# Patient Record
Sex: Female | Born: 1985
Health system: Southern US, Community
[De-identification: ages and names within clinical notes are randomized; demographics above are authoritative.]

## PROBLEM LIST (undated history)

## (undated) ENCOUNTER — Inpatient Hospital Stay (HOSPITAL_COMMUNITY): Payer: Self-pay

## (undated) DIAGNOSIS — A6000 Herpesviral infection of urogenital system, unspecified: Secondary | ICD-10-CM

## (undated) DIAGNOSIS — N898 Other specified noninflammatory disorders of vagina: Secondary | ICD-10-CM

## (undated) DIAGNOSIS — N760 Acute vaginitis: Secondary | ICD-10-CM

## (undated) DIAGNOSIS — Z3490 Encounter for supervision of normal pregnancy, unspecified, unspecified trimester: Secondary | ICD-10-CM

## (undated) DIAGNOSIS — B9689 Other specified bacterial agents as the cause of diseases classified elsewhere: Secondary | ICD-10-CM

## (undated) DIAGNOSIS — R519 Headache, unspecified: Secondary | ICD-10-CM

## (undated) HISTORY — DX: Headache, unspecified: R51.9

## (undated) HISTORY — DX: Acute vaginitis: N76.0

## (undated) HISTORY — DX: Other specified noninflammatory disorders of vagina: N89.8

## (undated) HISTORY — DX: Other specified bacterial agents as the cause of diseases classified elsewhere: B96.89

## (undated) HISTORY — PX: BREAST CYST EXCISION: SHX579

---
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
## 2005-02-28 ENCOUNTER — Ambulatory Visit (HOSPITAL_COMMUNITY): Admission: RE | Admit: 2005-02-28 | Discharge: 2005-02-28 | Payer: Self-pay | Admitting: *Deleted

## 2005-05-01 ENCOUNTER — Observation Stay (HOSPITAL_COMMUNITY): Admission: AD | Admit: 2005-05-01 | Discharge: 2005-05-02 | Payer: Self-pay | Admitting: *Deleted

## 2005-05-08 ENCOUNTER — Ambulatory Visit (HOSPITAL_COMMUNITY): Admission: RE | Admit: 2005-05-08 | Discharge: 2005-05-08 | Payer: Self-pay | Admitting: *Deleted

## 2005-07-10 ENCOUNTER — Ambulatory Visit (HOSPITAL_COMMUNITY): Admission: AD | Admit: 2005-07-10 | Discharge: 2005-07-10 | Payer: Self-pay | Admitting: *Deleted

## 2005-07-12 ENCOUNTER — Inpatient Hospital Stay (HOSPITAL_COMMUNITY): Admission: RE | Admit: 2005-07-12 | Discharge: 2005-07-15 | Payer: Self-pay | Admitting: *Deleted

## 2007-01-06 ENCOUNTER — Emergency Department (HOSPITAL_COMMUNITY): Admission: EM | Admit: 2007-01-06 | Discharge: 2007-01-06 | Payer: Self-pay | Admitting: Emergency Medicine

## 2007-07-31 ENCOUNTER — Emergency Department (HOSPITAL_COMMUNITY): Admission: EM | Admit: 2007-07-31 | Discharge: 2007-07-31 | Payer: Self-pay | Admitting: Emergency Medicine

## 2007-10-03 ENCOUNTER — Emergency Department (HOSPITAL_COMMUNITY): Admission: EM | Admit: 2007-10-03 | Discharge: 2007-10-03 | Payer: Self-pay | Admitting: Emergency Medicine

## 2007-10-07 ENCOUNTER — Encounter: Payer: Self-pay | Admitting: Family Medicine

## 2008-06-21 ENCOUNTER — Emergency Department (HOSPITAL_COMMUNITY): Admission: EM | Admit: 2008-06-21 | Discharge: 2008-06-22 | Payer: Self-pay | Admitting: Emergency Medicine

## 2008-12-26 ENCOUNTER — Encounter (HOSPITAL_COMMUNITY): Admission: RE | Admit: 2008-12-26 | Discharge: 2009-01-25 | Payer: Self-pay | Admitting: Family Medicine

## 2009-08-13 ENCOUNTER — Ambulatory Visit: Payer: Self-pay | Admitting: Orthopedic Surgery

## 2009-08-13 DIAGNOSIS — M238X9 Other internal derangements of unspecified knee: Secondary | ICD-10-CM | POA: Insufficient documentation

## 2009-09-04 ENCOUNTER — Encounter (HOSPITAL_COMMUNITY): Admission: RE | Admit: 2009-09-04 | Discharge: 2009-10-03 | Payer: Self-pay | Admitting: Orthopedic Surgery

## 2009-09-04 ENCOUNTER — Encounter: Payer: Self-pay | Admitting: Orthopedic Surgery

## 2009-09-17 ENCOUNTER — Encounter: Payer: Self-pay | Admitting: Orthopedic Surgery

## 2009-10-03 ENCOUNTER — Encounter: Payer: Self-pay | Admitting: Orthopedic Surgery

## 2010-04-15 ENCOUNTER — Emergency Department (HOSPITAL_COMMUNITY): Admission: EM | Admit: 2010-04-15 | Discharge: 2010-04-15 | Payer: Self-pay | Admitting: Emergency Medicine

## 2010-11-07 NOTE — Letter (Signed)
Summary: Historic Patient File  Historic Patient File   Imported By: Lind Guest 05/23/2010 16:34:37  _____________________________________________________________________  External Attachment:    Type:   Image     Comment:   External Document

## 2010-12-22 LAB — DIFFERENTIAL
Basophils Absolute: 0 10*3/uL (ref 0.0–0.1)
Basophils Relative: 1 % (ref 0–1)
Eosinophils Absolute: 0.3 10*3/uL (ref 0.0–0.7)
Eosinophils Relative: 5 % (ref 0–5)
Lymphs Abs: 2 10*3/uL (ref 0.7–4.0)
Neutrophils Relative %: 52 % (ref 43–77)

## 2010-12-22 LAB — URINALYSIS, ROUTINE W REFLEX MICROSCOPIC
Bilirubin Urine: NEGATIVE
Ketones, ur: NEGATIVE mg/dL
Leukocytes, UA: NEGATIVE
Nitrite: POSITIVE — AB
Protein, ur: NEGATIVE mg/dL

## 2010-12-22 LAB — COMPREHENSIVE METABOLIC PANEL
ALT: 9 U/L (ref 0–35)
AST: 18 U/L (ref 0–37)
Alkaline Phosphatase: 54 U/L (ref 39–117)
CO2: 26 mEq/L (ref 19–32)
Calcium: 9.1 mg/dL (ref 8.4–10.5)
Chloride: 104 mEq/L (ref 96–112)
GFR calc non Af Amer: >60 mL/min (ref >60)
Glucose, Bld: 112 mg/dL — ABNORMAL HIGH (ref 70–99)
Sodium: 136 mEq/L (ref 135–145)
Total Bilirubin: 0.3 mg/dL (ref 0.3–1.2)

## 2010-12-22 LAB — CBC
HCT: 33.8 % — ABNORMAL LOW (ref 36.0–46.0)
Hemoglobin: 11.4 g/dL — ABNORMAL LOW (ref 12.0–15.0)
MCHC: 33.8 g/dL (ref 30.0–36.0)
RBC: 3.8 MIL/uL — ABNORMAL LOW (ref 3.87–5.11)

## 2010-12-22 LAB — POCT PREGNANCY, URINE: Preg Test, Ur: NEGATIVE

## 2010-12-22 LAB — URINE CULTURE: Colony Count: >=100000

## 2011-02-21 NOTE — Op Note (Signed)
   NAME:  Sydney Mccall, Sydney Mccall                         ACCOUNT NO.:  [id number]   MEDICAL RECORD NO.:  [id number]                   PATIENT TYPE:  EMS   LOCATION:  ED                                   FACILITY:  [facility]   PHYSICIAN:  Dirk Dress. Katrinka Blazing, M.D.                DATE OF BIRTH:  1986-09-15   DATE OF PROCEDURE:  04/19/2002  DATE OF DISCHARGE:                                 OPERATIVE REPORT   PREOPERATIVE DIAGNOSIS:  Right breast mass.   POSTOPERATIVE DIAGNOSIS:  Fibroadenoma right breast.   PROCEDURE:  Right partial mastectomy.   SURGEON:  Dirk Dress. Katrinka Blazing, M.D.   DESCRIPTION OF PROCEDURE:  Under general anesthesia the patient's right  breast was prepped and draped in a sterile field.  A curvilinear incision  was made in the neck in the upper outer quadrant.  The subcutaneous tissue  was excised along with the mass.  The mass was sent for pathologic  evaluation.  The deep tissues were closed with 2-0 Biosyn.  The subcutaneous  tissues were closed with 3-0 Biosyn and the skin was closed with 4-0 Dexon.  Dressing was placed.  The patient was awakened from anesthesia, transferred  to a bed and taken to the postanesthetic care unit.                                                Dirk Dress. Katrinka Blazing, M.D.    LCS/MEDQ  D:  07/02/2002  T:  07/02/2002  Job:  [id number]

## 2011-02-21 NOTE — Op Note (Signed)
NAMEALAYLA, DETHLEFS NO.:  [id number]   MEDICAL RECORD NO.:  [id number]          PATIENT TYPE:  INP   LOCATION:  A403                          FACILITY:  [facility]   PHYSICIAN:  Langley Gauss, MD     DATE OF BIRTH:  Nov 12, 1985   DATE OF PROCEDURE:  DATE OF DISCHARGE:                                 OPERATIVE REPORT   DIAGNOSES:  1.  Intrauterine pregnancy at 40 weeks.  2.  Spontaneous rupture of membranes.   PROCEDURE PERFORMED:  1.  Spontaneous assisted vaginal delivery of macrosomic 9 pounds 4 ounces      female infant.  2.  Midline episiotomy with extension through a fourth degree perineal      laceration, repaired.   SURGEON:  Delivery performed by Langley Gauss, M.D.   ESTIMATED BLOOD LOSS:  Less than 500 cc.   COMPLICATIONS:  Extension of episiotomy with fourth degree laceration.   SPECIMENS:  Arterial cord gas and cord blood to laboratory. Placenta is  examined and noted be apparently intact with a three-vessel umbilical cord.   ANALGESIA:  The patient declined epidural during the course of labor,  received only IV narcotics for labor analgesia but after delivery of a total  of 50 mL of 1% lidocaine plain utilized episiotomy repair as well as  laceration.   DESCRIPTION OF PROCEDURE:  The patient was admitted in active labor.  Fetal  scalp electrode was placed and documented reassuring fetal heart rate.  Subsequently, the patient progressed very rapidly along the labor curve to  complete dilatation.  She was placed in the low lithotomy position, prepped  and draped in the usual sterile manner.  She pushed well during the short  second stage of labor with distension of the perineum; 30 cc 1% lidocaine  injected.  A small midline episiotomy was performed with delivery in a  direct OA position. Excellent perineal support was provided in this well-  controlled delivery.  Mouth and nares were bulb suctioned of clear amniotic  fluid.  Spontaneous rotation  occurred to a left anterior shoulder position  and gentle downward traction combined with expulsive efforts resulted in  delivery of the remainder the infant without difficulty.  Umbilical cord is  milked towards the infant.  Cord was doubly clamped and cut and infant is  taken to the nursery table for immediate reassessment.  Arterial cord gas  and cord blood were then obtained.  Gentle traction on the umbilical cord  results in separation which upon examination is felt to be intact placenta  with associated three-vessel umbilical cord.  Examination of the genital  tract reveals vaginal sidewall and periurethral area to be intact. There  was, however, about a 1 cm laceration of the rectal mucosa immediately in  the midline.  Rectal sphincter likewise was torn in the midline.  Irrigation  is performed of the perineal and pelvic area.  I reprepped with a Calgon  solution.  The rectal mucosa was identified and closed first with a single  through-and-through layer of 3-0 absorbable Monocryl suture. Tissues are  noted to be free  of any particulate matter.  My rectal examination at that  time reveals proper reapproximation of the tissue edges and no open areas.  A second running layer was placed to reapproximate the pararectal mucosal  layer in the midline to reinforce the first suture line.  The rectal  sphincter edges were then identified and grasped with Allis clamps.  These  were brought together in the midline and a series of four interrupted 2-0  Vicryl sutures are placed and then tied which restores the normal anatomy.  Calgon solution was again used to reprep the vaginal area.  The vaginal  mucosa is closed with a continuous running 0 chromic suture.  This suture  layer was continued on the perineal body to result in a two-layer closure  with restoration of the normal anatomy.  Rectovaginal examination confirms a  proper placement and good tone is noted in the rectal sphincter  postpartum.  The patient is no ordered on antibiotics,  Rocephin 1 gram IV q.12h.  Additionally, Colace 100 mg p.o. q.i.d. is started.  The patient and family  are advised of the extent of the laceration and of the importance of a  avoiding constipation in the postpartum period.      Langley Gauss, MD  Electronically Signed     DC/MEDQ  D:  07/12/2005  T:  07/13/2005  Job:  [id number]

## 2011-02-21 NOTE — H&P (Signed)
NAMELIRIO, BACH NO.:  [id number]   MEDICAL RECORD NO.:  [id number]          PATIENT TYPE:  INP   LOCATION:  LDR1                          FACILITY:  [facility]   PHYSICIAN:  Langley Gauss, MD     DATE OF BIRTH:  20-Feb-1986   DATE OF ADMISSION:  07/12/2005  DATE OF DISCHARGE:  LH                                HISTORY & PHYSICAL   HISTORY OF PRESENT ILLNESS:  The patient experienced the onset of uterine  contractions about 0230 with clear amniotic fluid.  She presented to [facility] about 0535 a.m. for admission.  The patient is a gravida 2,  para 0 at 40+ weeks gestation.  She had been seen in labor and delivery one  day previously, at which time she was not in labor.  She had no rupture of  membranes.  The patient's prenatal course has been complicated only by  reflux symptoms.  She is taking alternately Zantac and Prilosec for relief  of the reflux symptoms.   ALLERGIES:  NO KNOWN DRUG ALLERGIES.   PAST MEDICAL HISTORY:  Negative.   PAST SURGICAL HISTORY:  Negative.   PHYSICAL EXAMINATION:  GENERAL:  A pleasant black female.  VITAL SIGNS:  Blood pressure 129/87.  Weight is 185 pounds, height is 5 feet  4 inches.  Temperature 97.7, respiratory rate is 24, pulse 92.  HEENT:  Negative.  NECK:  No adenopathy.  Supple.  Thyroid is not palpable.  LUNGS:  Clear.  CARDIOVASCULAR:  Regular rate and rhythm.  ABDOMEN:  Soft and nontender.  No surgical scars are identified.  Vertex  presentation by Leopold's maneuvers.  PELVIC:  Initial examination by the nursing staff upon admission, the  patient was about 3-4 cm dilated.  I have just examined her now at 10:00  a.m., at which time she is 9 cm, 0 station, and completely effaced with  intact membranes.  Fetal scalp electrode is placed with a resultant  amniotomy.  The external monitor was revealing a reassuring fetal heart rate  with accelerations noted.  External toco reveals uterine contractions  occurring q.3-5 minutes.   PLAN:  The patient is noted to be GBS carrier status negative.  She has  declined epidural during the course of labor and is receiving only IV  narcotics.      Langley Gauss, MD  Electronically Signed    DC/MEDQ  D:  07/12/2005  T:  07/12/2005  Job:  [id number]

## 2011-02-21 NOTE — H&P (Signed)
[facility]  Patient:    Sydney Mccall, Sydney Mccall Visit Number: [id number] MRN: [id number]          Service Type: DSU Location: DAY Attending Physician:  Jeri Cos. Dictated by:   Elpidio Anis, M.D. Admit Date:  02/24/2002 Discharge Date: 02/24/2002                           History and Physical  HISTORY OF PRESENT ILLNESS:  This 25 year old female with history of right breast mass with progressive tenderness.  No history of trauma.  The mass has been present since December of 2002.  It is persistent.  She is scheduled for right partial mastectomy.  PAST MEDICAL HISTORY:  She is otherwise healthy young female.  No other medical illness.  No hospitalization.  MEDICATIONS:  No chronic medications.  ALLERGIES:  No known drug allergies.  REVIEW OF SYSTEMS:  Entirely negative.  PHYSICAL EXAMINATION:  VITAL SIGNS:  Blood pressure 122/64, pulse 76, respirations 16.  Weight 137 pounds.  GENERAL:  A healthy female child in no acute distress.  HEENT:  Normal.  NECK:  Supple.  No JVD or bruit.  CHEST:  Clear to auscultation.  HEART:  Regular rate and rhythm without murmur, gallop, or rub.  BREASTS:  A 1.5 cm mass right upper outer quadrant, mobile and nontender. Axilla is normal bilaterally without masses.  ABDOMEN:  Soft and nontender.  No masses.  EXTREMITIES:  No clubbing, cyanosis, or edema.  NEUROLOGICAL:  No focal motor, sensory, or cerebellar deficits.  IMPRESSION:  Persistent mass right upper outer quadrant of breast.  PLAN:  Right partial mastectomy. Dictated by:   Elpidio Anis, M.D. Attending Physician:  Jeri Cos. DD:  04/19/02 TD:  04/19/02 Job: [id number]

## 2011-02-21 NOTE — H&P (Signed)
[facility]  Patient:    Sydney Mccall, Sydney Mccall Visit Number: [id number] MRN: [id number]          Service Type: Attending:  Langley Gauss, M.D. Dictated by:   Langley Gauss, M.D. Adm. Date:  02/23/02                           History and Physical  HISTORY OF PRESENT ILLNESS:  This is a 25 year old gravida 1, para 0, with an absolute certain last menstrual period of December 05, 2001, which would place her at [redacted] weeks gestation.  The patient is admitted for dilatation and curettage with suction evacuation of uterine contents for missed abortion.  The patient is noted to have a large empty sac on ultrasound with no evidence of any fetal parts within this sac.  The patient has had the onset of some vaginal spotting the p.m. prior, however, no significant cramping or bleeding.  Patients prenatal course:  She was initially seen at the [facility] on Feb 15, 2002, at which time ultrasound was performed which revealed an empty intrauterine sac.  Quantitative beta hCG was performed at that time with instructions to follow up in 48 hours time.  The patient thereafter presented to our office on Feb 17, 2002, again having no vaginal bleeding or cramping, at which time a transvaginal ultrasound was repeated which revealed an empty intrauterine sac with the maximum diameter of 2.8 cm.  Quantitative beta hCG was also performed at that time with the final results of 32,000.  The patient now presents today on Feb 22, 2002 for followup, which again just reveals evidence of the intrauterine sac.  PAST MEDICAL HISTORY:  She has no prior medical or surgical history.  She has no prior hospitalizations.  ALLERGIES:  No known drug allergies.  CURRENT MEDICATIONS:  Prenatal vitamins only.  She was given a prescription for Lortab 10/500 to take postoperatively as well as Motrin as clinically needed.  SOCIAL HISTORY:  The patient is currently attending [employer]. She is employed at [employer] here in [place].  PHYSICAL EXAMINATION:  GENERAL:  Healthy, pleasant black female.  Height is 5 feet 4 inches.  Weight is 138 pounds.  VITAL SIGNS:  Blood pressure 102/60, pulse rate of 80, respiratory rate is 20.  HEENT:  Negative with no adenopathy.  NECK:  Supple.  Thyroid is nonpalpable.  LUNGS:  Clear.  CARDIOVASCULAR:  Regular rate and rhythm.  ABDOMEN:  Soft and nontender.  No surgical scars are identified.  EXTREMITIES:  The extremities are noted to be normal.  PELVIC:  Normal external genitalia.  No lesions or ulcerations identified. Cervix is noted to be closed, no vaginal bleeding identified.  Uterus is only minimally enlarged.  LABORATORY AND ACCESSORY DATA:  Transvaginal ultrasound performed Feb 22, 2002 reveals an oblong-shaped intrauterine sac with a maximal diameter of 3.27 cm, no evidence of any pregnancy identified within this sac, no free pelvic fluid.  Both right and left ovaries are visualized and noted to be normal with small follicles identified.  No adnexal masses appreciated.  No free pelvic fluid.  Laboratory studies:  The patient is noted to be Rh-positive.  HIV is negative. RPR is nonreactive. Blood type is B-positive.  Hemoglobin 10.5, hematocrit 32.5, platelet count 162,000.  Rubella immune.  ASSESSMENT:  Eleven-week intrauterine pregnancy with diagnosis of missed abortion by clinical dating criteria as well as correlating this with abnormal ultrasound.  The patient  is referred to [facility], at which time dilatation and curettage with suction will be performed on Feb 23, 2002.  Risks and benefits of the procedure were discussed with the patient to include risks of hemorrhage and uterine perforation. Dictated by:   Langley Gauss, M.D. Attending:  Langley Gauss, M.D. DD:  02/22/02 TD:  02/23/02 Job: [id number]

## 2011-02-21 NOTE — Op Note (Signed)
[facility]  Patient:    Sydney Mccall, Sydney Mccall Visit Number: [id number] MRN: [id number]          Service Type: DSU Location: DAY Attending Physician:  Jeri Cos. Dictated by:   Langley Gauss, M.D. Proc. Date: 02/24/02 Admit Date:  02/24/2002 Discharge Date: 02/24/2002                             Operative Report  PREOPERATIVE DIAGNOSIS:  A 10-1/2 weeks intrauterine pregnancy with missed abortion.  POSTOPERATIVE DIAGNOSIS:  A 10-1/2 weeks intrauterine pregnancy with missed abortion.  PROCEDURE PERFORMED:  Dilatation and curettage utilizing #8 suction catheter tip.  SURGEON:  Langley Gauss, M.D.  ESTIMATED BLOOD LOSS:  Less than 100 cc.  SPECIMENS:  Large amount of normal-appearing products of conception to pathology.  ANESTHESIA:  Induction of general anesthesia with the use of an oral airway per anesthesia staff.  SUMMARY:  The planned procedure was discussed with both the patient and her mother in the preoperative holding area.  DESCRIPTION OF PROCEDURE:  The patient was then taken to the operating room.  Vital signs were stable.  The patient underwent an uncomplicated induction of general anesthesia.  She was then placed in the dorsolithotomy position, prepped and draped in the usual sterile manner.  A small amount of vaginal bleeding is noted.  The speculum was then placed.  The cervix was noted to have evidence of a small amount of onset of dark red vaginal bleeding.  The cervix appears to be dilated about 0.5 cm with only mucus present at the endocervical os.  The uterus itself is noted to sound to a depth of 10 cm and was noted to be anteflexed.  Progressive dilatation was then performed up to a size #17 Hegar dilator which allows passage of the #8 suction catheter tip through the endocervical os and into the uterine cavity.  Gentle suction was then applied with a slow circular withdrawing motion.  The entire uterine cavity  was suctioned of large amounts of products of conception.  Two separate passes were required to remove all the tissue.  Fine banjo curettage was then performed in the uterine cavity to include the uterine fundus. Intrauterine contour is noted to be normal with no septations or irregularities.  A final pass was performed utilizing the suction curet and no additional tissue was obtained.  The procedure was then terminated.  The patient was noted to have minimal vaginal bleeding.  She was treated with 0.2 mg of IM Methergine following completion of the procedure.  Notably, she had been treated preoperatively with 1 g of IV Rocephin.  The patient was then reversed from anesthesia and taken to the recovery room in stable condition, at which time, operative findings were discussed with the patients awaiting family.  DISPOSITION:  The patient is to be discharged home on Feb 24, 2002.  She was given a copy of the standard discharge instructions.  DISCHARGE MEDICATIONS:  The patient is to take Motrin or Advil as needed.  She is given a prescription for Lortab on a p.r.n. basis, but may not require this.  FOLLOWUP:  The patient will follow up in the office in one weeks time.  PERTINENT LABORATORY STUDIES:  Is B-positive blood type.  RPR is nonreactive. Quantitative hCG is 32,000, and this represents a decrease from 42,000 to 32,000 preoperatively.  Of note, the patient is Rh status was initially reported as Rh negative.  This error was recognized and corrected by the laboratory before RhoGAM was administered.  The patients hemoglobin is 10.5, hematocrit 32.4.  HOSPITAL COURSE:  The patient admitted to the ambulatory surgical unit and taken to the OR where dilatation and curettage with #8 suction catheter tip was performed without complications.  Postoperatively, the patient did well.  She remained stable.  Vital signs remained stable.  She was able to ambulate and void spontaneously, thus  she was discharged to home on date of service. Dictated by:   Langley Gauss, M.D. Attending Physician:  Jeri Cos. DD:  02/25/02 TD:  03/01/02 Job: [id number]

## 2011-02-21 NOTE — Discharge Summary (Signed)
Sydney Mccall, Sydney Mccall NO.:  [id number]   MEDICAL RECORD NO.:  [id number]          PATIENT TYPE:  INP   LOCATION:  A403                          FACILITY:  [facility]   PHYSICIAN:  Langley Gauss, MD     DATE OF BIRTH:  05-28-1986   DATE OF ADMISSION:  07/12/2005  DATE OF DISCHARGE:  10/10/2006LH                                 DISCHARGE SUMMARY   DISCHARGE DIAGNOSES:  1.  Term pregnancy in labor.  2.  Spontaneous rupture of membranes.  3.  Anemia secondary to blood loss.   PROCEDURE:  Spontaneous assisted vaginal delivery of macrosomic, 9 pound 4  ounce female infant.  Delivery complicated by midline episiotomy which  extended to a fourth-degree perineal laceration, repaired.   FOLLOW UP:  The patient will follow up in the office in 2 weeks' time for  assessment of the perineal area.  She is advised of the importance of  continuing stool softeners to avoid solid stool which could result in  tearing of the sutures.  The patient has not had a bowel movement in the  postpartum state.  She states previous bowel movement was just prior to  delivery.  However, there is no benefit to continuing her hospitalization at  this time awaiting bowel movement.   DISCHARGE MEDICATIONS:  1.  Colace 100 mg p.o. b.i.d. x10 days.  2.  Tylox p.o.   SPECIAL INSTRUCTIONS:  Her anemia is very well-controlled, thus will not  start iron replacement as this could result in constipation problems.   LABORATORY DATA AND X-RAY FINDINGS:  GBS carrier status negative.  Hemoglobin 12.9, hematocrit 38.1, white count 7.4.  Postpartum day #1,  hemoglobin and hematocrit 8.8 and 26.0 with a white count of 13.3.  RPR  nonreactive.  B positive blood type.   HOSPITAL COURSE:  The patient presented earlier a.m. on July 12, 2005,  with spontaneous rupture of membranes in active labor.  The patient  progressed very rapidly along the labor curve at which time she underwent  spontaneous assisted vaginal  delivery without complications other than  extension of the episiotomy to a fourth-degree perineal laceration.  This  was repaired and a separate note dictated.  The patient in the postpartum  state was treated immediately with Unasyn 1.5 g IV q.6h. x3 doses.  She has  been on stool softeners with Colace 100 mg p.o. every four hours during the  postpartum state and has received Tylox for pain relief.  She has had no  significant complaints of perineal discomfort, no incontinence or flatus.  She, however, has not had a bowel movement at this point.  The infant did  have mild jaundice in the postpartum state requiring continued  hospitalization until today, July 15, 2005.  The mother herself has been  kept hospitalized to July 15, 2005, for continued perineal care  and assurance with compliance with stool softeners.  Both mother and infant  discharged home on July 15, 2005.  The infant's circumcision performed on  day of discharge.  Father of the infant reportedly to arrange financial  compensation for  the procedure.      Langley Gauss, MD  Electronically Signed     DC/MEDQ  D:  07/15/2005  T:  07/15/2005  Job:  [id number]

## 2011-07-07 LAB — RAPID STREP SCREEN (MED CTR MEBANE ONLY): Streptococcus, Group A Screen (Direct): POSITIVE — AB

## 2011-07-11 LAB — URINALYSIS, ROUTINE W REFLEX MICROSCOPIC
Bilirubin Urine: NEGATIVE
Ketones, ur: NEGATIVE
Nitrite: POSITIVE — AB
Protein, ur: 30 — AB
Urobilinogen, UA: 0.2

## 2011-07-11 LAB — PREGNANCY, URINE: Preg Test, Ur: NEGATIVE

## 2011-08-16 ENCOUNTER — Emergency Department (HOSPITAL_COMMUNITY): Payer: Medicaid Other

## 2011-08-16 ENCOUNTER — Encounter: Payer: Self-pay | Admitting: Emergency Medicine

## 2011-08-16 ENCOUNTER — Emergency Department (HOSPITAL_COMMUNITY)
Admission: EM | Admit: 2011-08-16 | Discharge: 2011-08-16 | Disposition: A | Discharge status: Home / Self Care | Payer: Medicaid Other | Attending: Emergency Medicine | Admitting: Emergency Medicine

## 2011-08-16 DIAGNOSIS — S93402A Sprain of unspecified ligament of left ankle, initial encounter: Secondary | ICD-10-CM

## 2011-08-16 DIAGNOSIS — S93409A Sprain of unspecified ligament of unspecified ankle, initial encounter: Secondary | ICD-10-CM | POA: Insufficient documentation

## 2011-08-16 DIAGNOSIS — W108XXA Fall (on) (from) other stairs and steps, initial encounter: Secondary | ICD-10-CM | POA: Insufficient documentation

## 2011-08-16 DIAGNOSIS — Y92009 Unspecified place in unspecified non-institutional (private) residence as the place of occurrence of the external cause: Secondary | ICD-10-CM | POA: Insufficient documentation

## 2011-08-16 MED ORDER — IBUPROFEN 800 MG PO TABS
800.0000 mg | ORAL_TABLET | Freq: Once | ORAL | Status: AC
  Administered 2011-08-16: 800 mg via ORAL
  Filled 2011-08-16: qty 1

## 2011-08-16 MED ORDER — MELOXICAM 7.5 MG PO TABS: ORAL_TABLET | ORAL | Status: DC

## 2011-08-16 MED ORDER — HYDROCODONE-ACETAMINOPHEN 5-325 MG PO TABS: ORAL_TABLET | ORAL | Status: DC

## 2011-08-16 MED ORDER — HYDROCODONE-ACETAMINOPHEN 5-325 MG PO TABS
2.0000 | ORAL_TABLET | Freq: Once | ORAL | Status: AC
  Administered 2011-08-16: 2 via ORAL
  Filled 2011-08-16: qty 2

## 2011-08-16 MED ORDER — PROMETHAZINE HCL 12.5 MG PO TABS
12.5000 mg | ORAL_TABLET | Freq: Once | ORAL | Status: AC
  Administered 2011-08-16: 12.5 mg via ORAL
  Filled 2011-08-16: qty 1

## 2011-08-16 NOTE — ED Provider Notes (Signed)
History     CSN: [id number] Arrival date & time: 08/16/2011  7:42 PM   First MD Initiated Contact with Patient 08/16/11 2029      Chief Complaint  Patient presents with  . Ankle Pain    (Consider location/radiation/quality/duration/timing/severity/associated sxs/prior treatment) Patient is a 25 y.o. female presenting with ankle pain. The history is provided by the patient.  Ankle Pain  The incident occurred 6 to 12 hours ago. The incident occurred at home. The injury mechanism was a fall. The pain is present in the left ankle. The quality of the pain is described as sharp. The pain is at a severity of 9/10. The pain is severe. The pain has been constant since onset. Associated symptoms include inability to bear weight. She reports no foreign bodies present. The symptoms are aggravated by bearing weight. She has tried nothing for the symptoms. The treatment provided no relief.    History reviewed. No pertinent past medical history.  History reviewed. No pertinent past surgical history.  History reviewed. No pertinent family history.  History  Substance Use Topics  . Smoking status: Never Smoker   . Smokeless tobacco: Not on file  . Alcohol Use: Yes     ocassionally    OB History    Grav Para Term Preterm Abortions TAB SAB Ect Mult Living                  Review of Systems  Constitutional: Negative for activity change.       All ROS Neg except as noted in HPI  HENT: Negative for nosebleeds and neck pain.   Eyes: Negative for photophobia and discharge.  Respiratory: Negative for cough, shortness of breath and wheezing.   Cardiovascular: Negative for chest pain and palpitations.  Gastrointestinal: Negative for abdominal pain and blood in stool.  Genitourinary: Negative for dysuria, frequency and hematuria.  Musculoskeletal: Positive for gait problem. Negative for back pain and arthralgias.  Skin: Negative.   Neurological: Negative for dizziness, seizures and speech  difficulty.  Psychiatric/Behavioral: Negative for hallucinations and confusion.    Allergies  Review of patient's allergies indicates not on file.  Home Medications   Current Outpatient Rx  Name Route Sig Dispense Refill  . HYDROCODONE-ACETAMINOPHEN 5-325 MG PO TABS  1 or 2 po q4h prn pain 20 tablet 0  . MELOXICAM 7.5 MG PO TABS  1 po bid with food 12 tablet 0    BP 91/74  Pulse 103  Temp(Src) 98.2 F (36.8 C) (Oral)  Resp 16  Ht 5\' 4"  (1.626 m)  Wt 200 lb (90.719 kg)  BMI 34.33 kg/m2  SpO2 97%  LMP 08/07/2011  Physical Exam  Nursing note and vitals reviewed. Constitutional: She is oriented to person, place, and time. She appears well-developed and well-nourished.  Non-toxic appearance.  HENT:  Head: Normocephalic.  Right Ear: Tympanic membrane and external ear normal.  Left Ear: Tympanic membrane and external ear normal.  Eyes: EOM and lids are normal. Pupils are equal, round, and reactive to light.  Neck: Normal range of motion. Neck supple. Carotid bruit is not present.  Cardiovascular: Normal rate, regular rhythm, normal heart sounds, intact distal pulses and normal pulses.   Pulmonary/Chest: Breath sounds normal. No respiratory distress.  Abdominal: Soft. Bowel sounds are normal. There is no tenderness. There is no guarding.  Musculoskeletal: Normal range of motion.  Lymphadenopathy:       Head (right side): No submandibular adenopathy present.       Head (left  side): No submandibular adenopathy present.    She has no cervical adenopathy.  Neurological: She is alert and oriented to person, place, and time. She has normal strength. No cranial nerve deficit or sensory deficit.  Skin: Skin is warm and dry.  Psychiatric: She has a normal mood and affect. Her speech is normal.    ED Course:Test results given to pt. pt fitted with ASO and crutches. Encouraged to apply ice and elevate.   Procedures (including critical care time)  Labs Reviewed - No data to display Dg  Ankle Complete Left  08/16/2011  *RADIOLOGY REPORT*  Clinical Data: Twisted left ankle .  LEFT ANKLE COMPLETE - 3+ VIEW  Comparison: None  Findings: The ankle mortise is maintained.  No acute fracture or osteochondral abnormality.  The mid and hind foot bony structures are intact.  There is moderate lateral soft tissue swelling.  IMPRESSION: No acute ankle fracture.  Original Report Authenticated By: P. Loralie Champagne, M.D.     1. Sprain of ankle, left       MDM  I have reviewed nursing notes, vital signs, and all appropriate lab and imaging results for this patient. Pt have increase pain when ASO applied. Advised that is pain not responding to ice and elevation in a few days to see Dr Romeo Apple for additional evaluation.        Kathie Dike, [place] 08/16/11 2112

## 2011-08-16 NOTE — ED Provider Notes (Signed)
Medical screening examination/treatment/procedure(s) were performed by non-physician practitioner and as supervising physician I was immediately available for consultation/collaboration.  Haadi Santellan, MD 08/16/11 2249 

## 2011-08-16 NOTE — ED Notes (Signed)
During triage, pt stated she felt hot like she was "going to pass out." Patient would not respond to questions unless asked twice. Patient stated she took 1 hydrocodone pill approximately 1600 today. Advised nurse in fast track.

## 2011-08-16 NOTE — ED Notes (Signed)
Patient stated she fell off step and twisted left ankle. Swelling to left ankle noted at triage.

## 2011-08-16 NOTE — ED Notes (Signed)
Pt a/ox4. Resp even and unlabored. NAD at this time. D/C instructions and Rx x2 reviewed with pt. Pt verbalized understanding. Pt to POV via w/c.

## 2011-10-07 NOTE — L&D Delivery Note (Signed)
    Delivery Note At 3:42 AM a viable female was delivered via Vaginal, Spontaneous Delivery (Presentation: Right Occiput Anterior).  APGAR: 9, 9; weight 9 lb 4.5 oz (4210 g).   Placenta status: Intact, Spontaneous.  Cord: 3 vessels with the following complications:  After delivery of the placenta brisk vaginal bleeding was noted. IV pitocin bolus was started. Pt did not respond to pitocin and was given of cytotec rectally. After no response patient was given IM methergine and Dr. Penne Lash was called to the room. Vagina and perineum were inspected and there were not any obviously heavily bleeding lacerations. Hemabate was given at this time. Bleeding began to slow, and Dr. Penne Lash attempted to place a bakri balloon at the bedside, and was unable. At this time bleeding had slowed, and the patient was hemodynamically stable.     Anesthesia: None  Episiotomy: None Lacerations: 2nd degree;Perineal Suture Repair: 3.0 vicryl vicryl rapide Est. Blood Loss (mL): 2000  Mom to postpartum.  Baby to nursery-stable.  Tawnya Crook 07/13/2012, 5:12 AM

## 2012-06-21 LAB — OB RESULTS CONSOLE ANTIBODY SCREEN: Antibody Screen: NEGATIVE

## 2012-06-21 LAB — OB RESULTS CONSOLE HEPATITIS B SURFACE ANTIGEN: Hepatitis B Surface Ag: NEGATIVE

## 2012-06-21 LAB — OB RESULTS CONSOLE GC/CHLAMYDIA
Chlamydia: NEGATIVE
Gonorrhea: NEGATIVE

## 2012-07-11 ENCOUNTER — Inpatient Hospital Stay (HOSPITAL_COMMUNITY): Payer: Medicaid Other

## 2012-07-11 ENCOUNTER — Encounter (HOSPITAL_COMMUNITY): Payer: Self-pay | Admitting: *Deleted

## 2012-07-11 ENCOUNTER — Inpatient Hospital Stay (HOSPITAL_COMMUNITY)
Admission: AD | Admit: 2012-07-11 | Discharge: 2012-07-11 | Disposition: A | Discharge status: Home / Self Care | Payer: Medicaid Other | Source: Ambulatory Visit | Attending: Obstetrics & Gynecology | Admitting: Obstetrics & Gynecology

## 2012-07-11 DIAGNOSIS — A6000 Herpesviral infection of urogenital system, unspecified: Secondary | ICD-10-CM | POA: Insufficient documentation

## 2012-07-11 DIAGNOSIS — O4690 Antepartum hemorrhage, unspecified, unspecified trimester: Secondary | ICD-10-CM | POA: Insufficient documentation

## 2012-07-11 DIAGNOSIS — O98519 Other viral diseases complicating pregnancy, unspecified trimester: Secondary | ICD-10-CM | POA: Insufficient documentation

## 2012-07-11 DIAGNOSIS — O479 False labor, unspecified: Secondary | ICD-10-CM

## 2012-07-11 HISTORY — DX: Herpesviral infection of urogenital system, unspecified: A60.00

## 2012-07-11 NOTE — MAU Provider Note (Signed)
History   Sydney Mccall is a 26 y.o. G57P1001 female at [redacted]w[redacted]d who present w/ report of small amount bright red postcoital vaginal bleeding when wiping this evening w/ some mild uc's.  Denies LOF.  Reports good fm.  Last SVE at [facility] per pt, 3cm.  Next pnv on Tuesday.    CSN: [id number]  Arrival date and time: 07/11/12 0033   None     Chief Complaint  Patient presents with  . Vaginal Bleeding   HPI  OB History    Grav Para Term Preterm Abortions TAB SAB Ect Mult Living   3 1 1       1       Past Medical History  Diagnosis Date  . Genital herpes     Past Surgical History  Procedure Date  . Breast cyst excision     No family history on file.  History  Substance Use Topics  . Smoking status: Never Smoker   . Smokeless tobacco: Not on file  . Alcohol Use: No    Allergies: No Known Allergies  Prescriptions prior to admission  Medication Sig Dispense Refill  . acyclovir (ZOVIRAX) 200 MG capsule Take 200 mg by mouth 1 day or 1 dose.      Marland Kitchen HYDROcodone-acetaminophen (NORCO) 5-325 MG per tablet 1 or 2 po q4h prn pain  20 tablet  0  . meloxicam (MOBIC) 7.5 MG tablet 1 po bid with food  12 tablet  0    Review of Systems  Constitutional: Negative.   HENT: Negative.   Eyes: Negative.   Respiratory: Negative.   Cardiovascular: Negative.   Gastrointestinal: Positive for abdominal pain (mild lower abd pain r/t uc's).  Genitourinary:       Small amount of bright red postcoital vaginal bleeding when wiping this evening  Musculoskeletal: Negative.   Skin: Negative.   Neurological: Negative.   Endo/Heme/Allergies: Negative.   Psychiatric/Behavioral: Negative.    Physical Exam   Blood pressure 137/64, pulse 103, temperature 98.2 F (36.8 C), resp. rate 20, height 5\' 3"  (1.6 m), weight 97.523 kg (215 lb), last menstrual period 08/07/2011, unknown if currently breastfeeding.  Physical Exam  Constitutional: She is oriented to person, place, and time. She appears  well-developed and well-nourished.  HENT:  Head: Normocephalic.  Neck: Normal range of motion.  Cardiovascular: Normal rate and regular rhythm.   Respiratory: Effort normal and breath sounds normal.  GI: Soft. There is no tenderness.       gravid  Genitourinary: Vagina normal and uterus normal.       Spec exam: no active bleeding from cervical os, Moderate amount dark red d/c in vaginal vault SVE: 4/50/-1 vtx, w/ + scalp stim, small amt bright red blood on exam glove  Musculoskeletal: Normal range of motion. She exhibits no edema.  Neurological: She is alert and oriented to person, place, and time. She has normal reflexes.  Skin: Skin is warm and dry.  Psychiatric: She has a normal mood and affect. Her behavior is normal. Judgment and thought content normal.    MAU Course  Procedures  Spec exam SVE x 3- no cervical change in 5+ hours U/S to assess for placental abruption: no evidence of abruption or previa, cephalic, normal afi  0345: no cervical change x 2 hours- discussed pt w/ dr. Erin Fulling Pt given option to stay for observation to determine if progress to active labor vs. D/c home, pt wants to stay at this time.  Will recheck cervix in app  2hours  Assessment and Plan  A:  [redacted]w[redacted]d SIUP  Cat I FHR  Postcoital VB  Possible latent phase labor  HSV 2+ on acyclovir for suppression- fob unaware   P:  D/C home  Keep appt at [facility] on Tues  Return [facility] for labor, srom, decreased FM, VB, or other concern  Pelvic rest      Marge Duncans 07/11/2012, 2:06 AM

## 2012-07-11 NOTE — MAU Provider Note (Signed)
Attestation of Attending Supervision of Advanced Practitioner (CNM/NP): Evaluation and management procedures were performed by the Advanced Practitioner under my supervision and collaboration.  I have reviewed the Advanced Practitioner's note and chart, and I agree with the management and plan.  HARRAWAY-SMITH, Hoa Deriso 8:34 AM

## 2012-07-11 NOTE — MAU Note (Signed)
Contractions earlier today. Spotting for the last hour after having intercourse

## 2012-07-11 NOTE — MAU Note (Signed)
Patient to ultrasound via wheelchair.

## 2012-07-12 ENCOUNTER — Encounter (HOSPITAL_COMMUNITY): Payer: Self-pay

## 2012-07-12 ENCOUNTER — Inpatient Hospital Stay (HOSPITAL_COMMUNITY)
Admission: AD | Admit: 2012-07-12 | Discharge: 2012-07-15 | DRG: 774 | Disposition: A | Discharge status: Home / Self Care | Payer: Medicaid Other | Source: Ambulatory Visit | Attending: Obstetrics & Gynecology | Admitting: Obstetrics & Gynecology

## 2012-07-12 DIAGNOSIS — IMO0001 Reserved for inherently not codable concepts without codable children: Secondary | ICD-10-CM

## 2012-07-12 LAB — CBC
HCT: 35.7 % — ABNORMAL LOW (ref 36.0–46.0)
MCHC: 33.3 g/dL (ref 30.0–36.0)
MCV: 87.7 fL (ref 78.0–100.0)
RDW: 15.2 % (ref 11.5–15.5)

## 2012-07-12 LAB — ABO/RH: ABO/RH(D): B POS

## 2012-07-12 LAB — PREPARE RBC (CROSSMATCH)

## 2012-07-12 MED ORDER — OXYTOCIN BOLUS FROM INFUSION
500.0000 mL | Freq: Once | INTRAVENOUS | Status: DC
  Filled 2012-07-12: qty 500

## 2012-07-12 MED ORDER — NALBUPHINE SYRINGE 5 MG/0.5 ML
10.0000 mg | INJECTION | INTRAMUSCULAR | Status: DC | PRN
  Administered 2012-07-12 – 2012-07-13 (×3): 10 mg via INTRAVENOUS
  Filled 2012-07-12 (×2): qty 1

## 2012-07-12 MED ORDER — IBUPROFEN 600 MG PO TABS: 600.0000 mg | ORAL_TABLET | Freq: Four times a day (QID) | ORAL | Status: DC | PRN

## 2012-07-12 MED ORDER — ONDANSETRON HCL 4 MG/2ML IJ SOLN: 4.0000 mg | Freq: Four times a day (QID) | INTRAMUSCULAR | Status: DC | PRN

## 2012-07-12 MED ORDER — LACTATED RINGERS IV SOLN: INTRAVENOUS | Status: DC

## 2012-07-12 MED ORDER — CITRIC ACID-SODIUM CITRATE 334-500 MG/5ML PO SOLN: 30.0000 mL | ORAL | Status: DC | PRN

## 2012-07-12 MED ORDER — ACETAMINOPHEN 325 MG PO TABS: 650.0000 mg | ORAL_TABLET | ORAL | Status: DC | PRN

## 2012-07-12 MED ORDER — OXYTOCIN 40 UNITS IN LACTATED RINGERS INFUSION - SIMPLE MED
62.5000 mL/h | Freq: Once | INTRAVENOUS | Status: DC
  Filled 2012-07-12 (×2): qty 1000

## 2012-07-12 MED ORDER — LACTATED RINGERS IV SOLN: 500.0000 mL | INTRAVENOUS | Status: DC | PRN

## 2012-07-12 MED ORDER — NALBUPHINE SYRINGE 5 MG/0.5 ML
5.0000 mg | INJECTION | INTRAMUSCULAR | Status: DC | PRN
  Administered 2012-07-12: 5 mg via INTRAVENOUS
  Filled 2012-07-12 (×2): qty 0.5

## 2012-07-12 MED ORDER — OXYCODONE-ACETAMINOPHEN 5-325 MG PO TABS: 1.0000 | ORAL_TABLET | ORAL | Status: DC | PRN

## 2012-07-12 MED ORDER — LIDOCAINE HCL (PF) 1 % IJ SOLN
30.0000 mL | INTRAMUSCULAR | Status: DC | PRN
  Administered 2012-07-13: 30 mL via SUBCUTANEOUS
  Filled 2012-07-12: qty 30

## 2012-07-12 NOTE — H&P (Signed)
Sydney Mccall is a 26 y.o. female G3P1001 [redacted]w[redacted]d presenting for painful contractions every 2-4 minutes, which she is breathing through.  Her last delivery was 40 weeks and that baby weighed about 9 pounds, she is unsure if this child feels larger or not. She is accompanied by her significant other.  Maternal Medical History:  Reason for admission: Reason for admission: contractions.  Reason for Admission:   nauseaContractions: Onset was less than 1 hour ago.   Frequency: regular.   Duration is approximately 3 minutes.   Perceived severity is moderate.      OB History    Grav Para Term Preterm Abortions TAB SAB Ect Mult Living   3 1 1       1      Past Medical History  Diagnosis Date  . Genital herpes    Past Surgical History  Procedure Date  . Breast cyst excision    Family History: family history is not on file. Social History:  reports that she has never smoked. She does not have any smokeless tobacco history on file. She reports that she does not drink alcohol or use illicit drugs.   Prenatal Transfer Tool  Maternal Diabetes: No Genetic Screening: Normal Maternal Ultrasounds/Referrals: Normal Fetal Ultrasounds or other Referrals:  None Maternal Substance Abuse:  No Significant Maternal Medications:  None Significant Maternal Lab Results:  Lab values include: Group B Strep negative Other Comments:  None  Review of Systems  Constitutional: Negative for fever and chills.  Eyes: Negative for blurred vision and double vision.  Respiratory: Negative for cough, shortness of breath and wheezing.   Cardiovascular: Negative for chest pain and palpitations.  Gastrointestinal: Positive for abdominal pain. Negative for nausea, vomiting and diarrhea.  Genitourinary: Negative for dysuria, urgency and frequency.  Musculoskeletal: Negative.   Skin: Negative for rash.  Neurological: Negative for dizziness, weakness and headaches.    Dilation: 4 Effacement (%): 100 Station:  0 Exam by:: Mathews Robinsons CNM Blood pressure 157/85, pulse 93, temperature 97.9 F (36.6 C), temperature source Oral, resp. rate 20, last menstrual period 08/07/2011, unknown if currently breastfeeding. Maternal Exam:  Uterine Assessment: Contraction strength is moderate.  Contraction duration is 3 minutes. Contraction frequency is regular.   Abdomen: Fetal presentation: vertex  Introitus: Normal vulva. Normal vagina.  Cervix: Cervix evaluated by digital exam.     Fetal Exam Fetal Monitor Review: Variability: moderate (6-25 bpm).   Pattern: accelerations present and no decelerations.    Fetal State Assessment: Category I - tracings are normal.     Physical Exam  Constitutional: She is oriented to person, place, and time. She appears well-developed and well-nourished. She appears distressed.  HENT:  Head: Normocephalic and atraumatic.  Eyes: Conjunctivae normal and EOM are normal. Right eye exhibits no discharge. Left eye exhibits no discharge. No scleral icterus.  Cardiovascular: Normal rate, regular rhythm and normal heart sounds.  Exam reveals no gallop and no friction rub.   No murmur heard. Respiratory: Effort normal and breath sounds normal. No respiratory distress. She has no wheezes. She has no rales.  GI:       Gravid   Genitourinary: Vagina normal.       Bloody show   Musculoskeletal: She exhibits no edema and no tenderness.  Neurological: She is oriented to person, place, and time.  Skin: Skin is warm and dry. She is not diaphoretic.  Psychiatric: She has a normal mood and affect.    Dilation: 4 Effacement (%): 100 Cervical Position: Middle Station:  0 Presentation: Vertex Exam by:: Mathews Robinsons CNM  Prenatal labs: ABO, Rh:  B+  Antibody:   Rubella:  Positive RPR:    HBsAg:   Negative HIV:   Negative GBS:   Negative  Assessment/Plan: 1. Transfer to L&D with routine delivery orders 2. Anticipate NSVD   Felix Pacini 07/12/2012, 9:02 PM

## 2012-07-13 ENCOUNTER — Encounter (HOSPITAL_COMMUNITY): Payer: Self-pay | Admitting: *Deleted

## 2012-07-13 LAB — CBC
HCT: 21.7 % — ABNORMAL LOW (ref 36.0–46.0)
Hemoglobin: 7.2 g/dL — ABNORMAL LOW (ref 12.0–15.0)
MCH: 28.9 pg (ref 26.0–34.0)
MCHC: 33.2 g/dL (ref 30.0–36.0)
MCV: 87.1 fL (ref 78.0–100.0)
RDW: 15.3 % (ref 11.5–15.5)

## 2012-07-13 MED ORDER — METHYLERGONOVINE MALEATE 0.2 MG/ML IJ SOLN
INTRAMUSCULAR | Status: AC
  Filled 2012-07-13: qty 1

## 2012-07-13 MED ORDER — METHYLERGONOVINE MALEATE 0.2 MG PO TABS: 0.2000 mg | ORAL_TABLET | ORAL | Status: DC | PRN

## 2012-07-13 MED ORDER — MISOPROSTOL 200 MCG PO TABS
ORAL_TABLET | ORAL | Status: AC
  Administered 2012-07-13: 200 ug via VAGINAL
  Filled 2012-07-13: qty 5

## 2012-07-13 MED ORDER — OXYTOCIN 40 UNITS IN LACTATED RINGERS INFUSION - SIMPLE MED: 62.5000 mL/h | INTRAVENOUS | Status: DC

## 2012-07-13 MED ORDER — DIBUCAINE 1 % RE OINT
1.0000 "application " | TOPICAL_OINTMENT | RECTAL | Status: DC | PRN
  Filled 2012-07-13: qty 28

## 2012-07-13 MED ORDER — BENZOCAINE-MENTHOL 20-0.5 % EX AERO
1.0000 "application " | INHALATION_SPRAY | CUTANEOUS | Status: DC | PRN
  Administered 2012-07-14: 1 via TOPICAL
  Filled 2012-07-13 (×2): qty 56

## 2012-07-13 MED ORDER — ONDANSETRON HCL 4 MG/2ML IJ SOLN: 4.0000 mg | INTRAMUSCULAR | Status: DC | PRN

## 2012-07-13 MED ORDER — METHYLERGONOVINE MALEATE 0.2 MG/ML IJ SOLN: 0.2000 mg | INTRAMUSCULAR | Status: DC | PRN

## 2012-07-13 MED ORDER — METHYLERGONOVINE MALEATE 0.2 MG/ML IJ SOLN: 0.2000 mg | Freq: Once | INTRAMUSCULAR | Status: DC

## 2012-07-13 MED ORDER — SIMETHICONE 80 MG PO CHEW: 80.0000 mg | CHEWABLE_TABLET | ORAL | Status: DC | PRN

## 2012-07-13 MED ORDER — DIPHENHYDRAMINE HCL 25 MG PO CAPS: 25.0000 mg | ORAL_CAPSULE | Freq: Four times a day (QID) | ORAL | Status: DC | PRN

## 2012-07-13 MED ORDER — NALBUPHINE SYRINGE 5 MG/0.5 ML
INJECTION | INTRAMUSCULAR | Status: AC
  Filled 2012-07-13: qty 1

## 2012-07-13 MED ORDER — FENTANYL CITRATE 0.05 MG/ML IJ SOLN
INTRAMUSCULAR | Status: AC
  Filled 2012-07-13: qty 2

## 2012-07-13 MED ORDER — FERROUS SULFATE 325 (65 FE) MG PO TABS
325.0000 mg | ORAL_TABLET | Freq: Two times a day (BID) | ORAL | Status: DC
  Administered 2012-07-13 – 2012-07-14 (×3): 325 mg via ORAL
  Filled 2012-07-13 (×5): qty 1

## 2012-07-13 MED ORDER — TETANUS-DIPHTH-ACELL PERTUSSIS 5-2.5-18.5 LF-MCG/0.5 IM SUSP: 0.5000 mL | Freq: Once | INTRAMUSCULAR | Status: DC

## 2012-07-13 MED ORDER — PRENATAL MULTIVITAMIN CH
1.0000 | ORAL_TABLET | Freq: Every day | ORAL | Status: DC
  Administered 2012-07-14: 1 via ORAL
  Filled 2012-07-13: qty 1

## 2012-07-13 MED ORDER — LANOLIN HYDROUS EX OINT: TOPICAL_OINTMENT | CUTANEOUS | Status: DC | PRN

## 2012-07-13 MED ORDER — DIPHENOXYLATE-ATROPINE 2.5-0.025 MG PO TABS
2.0000 | ORAL_TABLET | Freq: Four times a day (QID) | ORAL | Status: DC | PRN
  Administered 2012-07-13: 2 via ORAL
  Filled 2012-07-13: qty 2

## 2012-07-13 MED ORDER — SENNOSIDES-DOCUSATE SODIUM 8.6-50 MG PO TABS: 2.0000 | ORAL_TABLET | Freq: Every day | ORAL | Status: DC

## 2012-07-13 MED ORDER — WITCH HAZEL-GLYCERIN EX PADS: 1.0000 "application " | MEDICATED_PAD | CUTANEOUS | Status: DC | PRN

## 2012-07-13 MED ORDER — OXYCODONE-ACETAMINOPHEN 5-325 MG PO TABS: 1.0000 | ORAL_TABLET | ORAL | Status: DC | PRN

## 2012-07-13 MED ORDER — ZOLPIDEM TARTRATE 5 MG PO TABS: 5.0000 mg | ORAL_TABLET | Freq: Every evening | ORAL | Status: DC | PRN

## 2012-07-13 MED ORDER — CARBOPROST TROMETHAMINE 250 MCG/ML IM SOLN
250.0000 ug | Freq: Once | INTRAMUSCULAR | Status: AC
  Administered 2012-07-13: 250 ug via INTRAMUSCULAR

## 2012-07-13 MED ORDER — MISOPROSTOL 25 MCG QUARTER TABLET: 100.0000 ug | ORAL_TABLET | Freq: Once | ORAL | Status: DC

## 2012-07-13 MED ORDER — IBUPROFEN 600 MG PO TABS
600.0000 mg | ORAL_TABLET | Freq: Four times a day (QID) | ORAL | Status: DC
  Administered 2012-07-13 – 2012-07-14 (×8): 600 mg via ORAL
  Filled 2012-07-13 (×8): qty 1

## 2012-07-13 MED ORDER — ONDANSETRON HCL 4 MG PO TABS: 4.0000 mg | ORAL_TABLET | ORAL | Status: DC | PRN

## 2012-07-13 MED ORDER — FENTANYL CITRATE 0.05 MG/ML IJ SOLN
100.0000 ug | INTRAMUSCULAR | Status: DC | PRN
  Administered 2012-07-13: 100 ug via INTRAVENOUS

## 2012-07-13 MED ORDER — CARBOPROST TROMETHAMINE 250 MCG/ML IM SOLN
INTRAMUSCULAR | Status: AC
  Filled 2012-07-13: qty 1

## 2012-07-13 NOTE — Progress Notes (Signed)
UR chart review completed.  

## 2012-07-13 NOTE — Progress Notes (Signed)
MD called due to low plt count. Okay to give motrin with plt count of 82.

## 2012-07-13 NOTE — H&P (Addendum)
I was present with the resident for evaluation and agree with the plan of care.  

## 2012-07-13 NOTE — Progress Notes (Signed)
Pt declined  Tdap

## 2012-07-14 LAB — CBC
HCT: 21.4 % — ABNORMAL LOW (ref 36.0–46.0)
Hemoglobin: 5.3 g/dL — CL (ref 12.0–15.0)
Hemoglobin: 7.2 g/dL — ABNORMAL LOW (ref 12.0–15.0)
MCH: 28.3 pg (ref 26.0–34.0)
MCH: 29.1 pg (ref 26.0–34.0)
MCHC: 33.6 g/dL (ref 30.0–36.0)
MCV: 87.2 fL (ref 78.0–100.0)
MCV: 86.6 fL (ref 78.0–100.0)
Platelets: 84 10*3/uL — ABNORMAL LOW (ref 150–400)
RBC: 1.87 MIL/uL — ABNORMAL LOW (ref 3.87–5.11)

## 2012-07-14 NOTE — Progress Notes (Cosign Needed)
Post Partum Day 1 following NSVD. Complicated by PPH. EBL 2000cc.  Subjective: Patient reports that she is doing well. She has been ambulating, eating/drinking. She has been voiding through foley catheter. She has had +flatus, but no BM. She reports that breast feeding is going well. She has had no CP, SOB, LE edema, dizziness, syncope, lightheadedness.   Objective: Blood pressure 112/70, pulse 101, temperature 98.3 F (36.8 C), temperature source Oral, resp. rate 18, last menstrual period 08/07/2011, SpO2 100.00%, unknown if currently breastfeeding.  Filed Vitals:   07/13/12 2030 07/13/12 2035 07/13/12 2045 07/14/12 0555  BP: 111/53 101/58 104/54 112/70  Pulse: 106 128 146 101  Temp: 98.1 F (36.7 C)   98.3 F (36.8 C)  TempSrc: Oral   Oral  Resp: 18   18  SpO2: 100%      I/O last 3 completed shifts: In: 500 [I.V.:500] Out: 4725 [Urine:2725; Blood:2000]    Physical Exam:  General: alert, cooperative and no distress Cardio: normal rate and rhythm, no murmurs/gallops/rubs, 2+ DP pulses bilateral Pulm: normal rate, no respiratory distress, no wheezes/rales/rhonchi  Lochia: appropriate Uterine Fundus: firm DVT Evaluation: No evidence of DVT seen on physical exam. Negative Homan's sign. No cords or calf tenderness. No significant calf/ankle edema.   Basename 07/14/12 0520 07/13/12 1017  HGB 5.3* 7.2*  HCT 16.3* 21.7*    Assessment/Plan: Breastfeeding and Contraception (no current plans) Patient's Hgb 5.3 this AM. She is currently stable and asymptomatic. She will receive 2 units of blood and have repeat CBC. Continue with ferrous sulfate.  Circumcision planned as OP.    LOS: 2 days   Winfield Cunas 07/14/2012, 7:38 AM

## 2012-07-15 LAB — TYPE AND SCREEN
Unit division: 0
Unit division: 0

## 2012-07-15 MED ORDER — MEASLES, MUMPS & RUBELLA VAC ~~LOC~~ INJ: 0.5000 mL | INJECTION | Freq: Once | SUBCUTANEOUS | Status: DC

## 2012-07-15 MED ORDER — DOCUSATE SODIUM 100 MG PO CAPS: 100.0000 mg | ORAL_CAPSULE | Freq: Two times a day (BID) | ORAL | Status: DC

## 2012-07-15 MED ORDER — WITCH HAZEL-GLYCERIN EX PADS: 1.0000 "application " | MEDICATED_PAD | CUTANEOUS | Status: DC | PRN

## 2012-07-15 MED ORDER — ONDANSETRON HCL 4 MG PO TABS: 4.0000 mg | ORAL_TABLET | ORAL | Status: DC | PRN

## 2012-07-15 MED ORDER — MAGNESIUM HYDROXIDE 400 MG/5ML PO SUSP: 30.0000 mL | ORAL | Status: DC | PRN

## 2012-07-15 MED ORDER — PRENATAL MULTIVITAMIN CH: 1.0000 | ORAL_TABLET | Freq: Every day | ORAL | Status: DC

## 2012-07-15 MED ORDER — ZOLPIDEM TARTRATE 5 MG PO TABS: 5.0000 mg | ORAL_TABLET | Freq: Every evening | ORAL | Status: DC | PRN

## 2012-07-15 MED ORDER — METHYLERGONOVINE MALEATE 0.2 MG PO TABS: 0.2000 mg | ORAL_TABLET | ORAL | Status: DC | PRN

## 2012-07-15 MED ORDER — SENNOSIDES-DOCUSATE SODIUM 8.6-50 MG PO TABS: 2.0000 | ORAL_TABLET | Freq: Every day | ORAL | Status: DC

## 2012-07-15 MED ORDER — OXYCODONE-ACETAMINOPHEN 5-325 MG PO TABS: 1.0000 | ORAL_TABLET | ORAL | Status: DC | PRN

## 2012-07-15 MED ORDER — DIBUCAINE 1 % RE OINT: 1.0000 "application " | TOPICAL_OINTMENT | RECTAL | Status: DC | PRN

## 2012-07-15 MED ORDER — FERROUS SULFATE 325 (65 FE) MG PO TABS
325.0000 mg | ORAL_TABLET | Freq: Once | ORAL | Status: AC
  Administered 2012-07-15: 325 mg via ORAL
  Filled 2012-07-15: qty 1

## 2012-07-15 MED ORDER — IBUPROFEN 600 MG PO TABS
600.0000 mg | ORAL_TABLET | Freq: Four times a day (QID) | ORAL | Status: DC
Start: 2012-07-15 — End: 2012-07-15

## 2012-07-15 MED ORDER — FERROUS SULFATE 325 (65 FE) MG PO TABS: 325.0000 mg | ORAL_TABLET | Freq: Two times a day (BID) | ORAL | Status: DC

## 2012-07-15 MED ORDER — IBUPROFEN 600 MG PO TABS
600.0000 mg | ORAL_TABLET | ORAL | Status: AC
  Administered 2012-07-15: 600 mg via ORAL

## 2012-07-15 MED ORDER — BENZOCAINE-MENTHOL 20-0.5 % EX AERO: 1.0000 "application " | INHALATION_SPRAY | CUTANEOUS | Status: DC | PRN

## 2012-07-15 MED ORDER — LANOLIN HYDROUS EX OINT: TOPICAL_OINTMENT | CUTANEOUS | Status: DC | PRN

## 2012-07-15 MED ORDER — FERROUS GLUCONATE IRON 246 (28 FE) MG PO TABS: 246.0000 mg | ORAL_TABLET | Freq: Every day | ORAL | Status: DC

## 2012-07-15 MED ORDER — METHYLERGONOVINE MALEATE 0.2 MG/ML IJ SOLN: 0.2000 mg | INTRAMUSCULAR | Status: DC | PRN

## 2012-07-15 MED ORDER — DIPHENHYDRAMINE HCL 25 MG PO CAPS: 25.0000 mg | ORAL_CAPSULE | Freq: Four times a day (QID) | ORAL | Status: DC | PRN

## 2012-07-15 MED ORDER — TETANUS-DIPHTH-ACELL PERTUSSIS 5-2.5-18.5 LF-MCG/0.5 IM SUSP: 0.5000 mL | Freq: Once | INTRAMUSCULAR | Status: DC

## 2012-07-15 MED ORDER — NORETHINDRONE 0.35 MG PO TABS: 1.0000 | ORAL_TABLET | Freq: Every day | ORAL | Status: DC

## 2012-07-15 MED ORDER — ONDANSETRON HCL 4 MG/2ML IJ SOLN: 4.0000 mg | INTRAMUSCULAR | Status: DC | PRN

## 2012-07-15 MED ORDER — SIMETHICONE 80 MG PO CHEW: 80.0000 mg | CHEWABLE_TABLET | ORAL | Status: DC | PRN

## 2012-07-15 NOTE — Discharge Summary (Signed)
Obstetric Discharge Summary Reason for Admission: onset of labor Prenatal Procedures: none Intrapartum Procedures: spontaneous vaginal delivery Postpartum Procedures: transfusion (blood due to PPH and low Hgb yesterday) Complications-Operative and Postpartum: hemorrhage  EBL was 2000cc  Pt is a 26 y.o. [id number] who presented in spontaneous labor at [redacted]w[redacted]d. She had a normal SVD with a significant postpartum  Hemorrhage. Hemoglobin went from 11.9 to 5.3 on the day after delivery. She was transfused 2 units of RBCs, bringing her hemoglobin to 7.2. She remained asymptomatic from her anemia.   Patient Active Problem List  Diagnosis  . KNEE JOINT INSTABILITY  . Postpartum hemorrhage    Subjective:  Patient continues to do well. This morning she was walking around in room. She reports eating/drinking, voiding, +flatus/BM. She reports no CP, SOB, LE edema, dizziness, lightheadedness, syncope. Breast feeding and formula.    Hemoglobin  Date Value Range Status  07/14/2012 7.2* 12.0 - 15.0 g/dL Final     REPEATED TO VERIFY     DELTA CHECK NOTED     POST TRANSFUSION SPECIMEN     HCT  Date Value Range Status  07/14/2012 21.4* 36.0 - 46.0 % Final   Filed Vitals:   07/14/12 1525 07/14/12 2148 07/14/12 2200 07/15/12 0545  BP: 106/51 87/50 105/60 105/58  Pulse: 114 105  101  Temp: 99.2 F (37.3 C) 98.2 F (36.8 C)  97.7 F (36.5 C)  TempSrc: Oral Oral  Oral  Resp: 18 17  18   SpO2:       Physical Exam:  General: alert, cooperative and no distress Cardio: normal rate, 2+ DP pulsed bilateral Pulm: normal rate, no respiratory distress Lochia: appropriate Uterine Fundus: firm DVT Evaluation: No evidence of DVT seen on physical exam. Negative Homan's sign. No cords or calf tenderness. No significant calf/ankle edema.  Discharge Diagnoses: Term Pregnancy-delivered  Discharge Information: Date: 07/15/2012 Activity: pelvic rest Diet: routine Medications: Ibuprofen, Colace and  Iron Condition: stable and improved Instructions: refer to practice specific booklet Discharge to: home Contraception: Patient uncertain of plan. Would like something temporary like OCPs.  Circumcision: To be done as OP  Newborn Data: Live born female  Birth Weight: 9 lb 4.5 oz (4210 g) APGAR: 9, 9  Home with mother.  Winfield Cunas 07/15/2012, 7:56 AM   I saw and examined patient and agree with above. Patient is to follow up at [facility] in 6 weeks.  Napoleon Form, MD

## 2012-07-21 NOTE — H&P (Signed)
Agree with note of resident and CNM

## 2012-10-06 NOTE — L&D Delivery Note (Signed)
Delivery Note 27 y.o. [id number] @ [redacted]w[redacted]d was admitted for PROM.  Pt declined Pitocin initially.  After >18 hours of membranes ruptured and no cervical change from admission check, Pitocin started and pt rapidly progressed to complete and pushed 1-2 times to deliver.  She used IV pain medication for labor.   At 5:03 AM a viable and healthy female was delivered via Vaginal, Spontaneous Delivery (Presentation: Occiput Anterior).  APGAR: 9, 9; weight pending.   Placenta status: Intact, Spontaneous.  Cord: 3 vessels with the following complications: None.    Anesthesia: None  Episiotomy: None Lacerations: None Suture Repair: n/a Est. Blood Loss (mL): 400  Mom to postpartum.  Baby to Couplet care / Skin to Skin.  Mccall, Sydney Leeth 08/24/2013, 5:33 AM

## 2013-02-10 ENCOUNTER — Telehealth: Payer: Self-pay | Admitting: Nurse Practitioner

## 2013-02-10 ENCOUNTER — Encounter: Payer: Self-pay | Admitting: *Deleted

## 2013-02-10 ENCOUNTER — Other Ambulatory Visit: Payer: Self-pay | Admitting: Nurse Practitioner

## 2013-02-10 MED ORDER — NORGESTIMATE-ETH ESTRADIOL 0.25-35 MG-MCG PO TABS: 1.0000 | ORAL_TABLET | Freq: Every day | ORAL | Status: DC

## 2013-02-10 NOTE — Telephone Encounter (Signed)
Patient says that the birth control that she is on is making her bloated. Can you call her in a different type of birth control?

## 2013-02-10 NOTE — Telephone Encounter (Signed)
I cannot find where we prescribed this.  Also we do not have notes on her GYN history.  Please have patient contact her GYN to change pills.  There are other choices available.

## 2013-02-10 NOTE — Telephone Encounter (Signed)
Send birth control pill to [facility] pharmacy.

## 2013-02-10 NOTE — Telephone Encounter (Signed)
Patient states that the birth control pill was prescribed by the hospital after she gave birth to her son. She was told to contact her primary doctor for further therapy. She said she was prescribed Heather.

## 2013-03-07 ENCOUNTER — Other Ambulatory Visit: Payer: Self-pay | Admitting: Obstetrics & Gynecology

## 2013-03-07 ENCOUNTER — Ambulatory Visit (INDEPENDENT_AMBULATORY_CARE_PROVIDER_SITE_OTHER): Payer: Medicaid Other | Admitting: Obstetrics & Gynecology

## 2013-03-07 ENCOUNTER — Encounter: Payer: Self-pay | Admitting: Obstetrics & Gynecology

## 2013-03-07 VITALS — BP 138/84 | Ht 63.0 in | Wt 199.0 lb

## 2013-03-07 DIAGNOSIS — Z3201 Encounter for pregnancy test, result positive: Secondary | ICD-10-CM

## 2013-03-07 DIAGNOSIS — Z3200 Encounter for pregnancy test, result unknown: Secondary | ICD-10-CM

## 2013-03-07 DIAGNOSIS — O3680X0 Pregnancy with inconclusive fetal viability, not applicable or unspecified: Secondary | ICD-10-CM

## 2013-03-07 LAB — HCG, QUANTITATIVE, PREGNANCY: hCG, Beta Chain, Quant, S: 15586 m[IU]/mL

## 2013-03-07 NOTE — Progress Notes (Signed)
Patient ID: Sydney Mccall, female   DOB: Mar 05, 1986, 27 y.o.   MRN: [id number] Pt here today for pregnancy test. UPT is positive, but pt is unsure of LMP. Will send pt to lab for a stat [facility]. Pt will call back this afternoon for results.

## 2013-03-07 NOTE — Addendum Note (Signed)
Addended by: Richardson Chiquito on: 03/07/2013 09:39 AM   Modules accepted: Orders

## 2013-03-08 ENCOUNTER — Telehealth: Payer: Self-pay | Admitting: Adult Health

## 2013-03-08 ENCOUNTER — Encounter: Payer: Self-pay | Admitting: *Deleted

## 2013-03-08 NOTE — Telephone Encounter (Signed)
Left message that numbers were good, needs [place] appt

## 2013-03-09 ENCOUNTER — Ambulatory Visit (INDEPENDENT_AMBULATORY_CARE_PROVIDER_SITE_OTHER): Payer: Medicaid Other

## 2013-03-09 ENCOUNTER — Telehealth: Payer: Self-pay | Admitting: Obstetrics and Gynecology

## 2013-03-09 ENCOUNTER — Other Ambulatory Visit: Payer: Self-pay | Admitting: Obstetrics & Gynecology

## 2013-03-09 DIAGNOSIS — O3680X0 Pregnancy with inconclusive fetal viability, not applicable or unspecified: Secondary | ICD-10-CM

## 2013-03-09 DIAGNOSIS — O99320 Drug use complicating pregnancy, unspecified trimester: Secondary | ICD-10-CM

## 2013-03-09 DIAGNOSIS — F1920 Other psychoactive substance dependence, uncomplicated: Secondary | ICD-10-CM

## 2013-03-09 NOTE — Progress Notes (Signed)
U/S-single active fetus, meas c/w 16+6wks EDD 08/18/2013, cx long and closed, post gr 0 plac, pt to return for full anat u/s

## 2013-03-15 NOTE — Telephone Encounter (Signed)
Pt requesting date of conception, pt informed based on her due date conception date was around 11/25/2012.

## 2013-03-22 ENCOUNTER — Telehealth: Payer: Self-pay | Admitting: Adult Health

## 2013-03-22 NOTE — Telephone Encounter (Signed)
C/o pain in "belly button," no vaginal bleeding, +FM. Informed pt to push fluids, rest, and can take tylenol. Pt to call office back if any vaginal bleeding or severe pain/cramping. Pt verbalized understanding.

## 2013-03-24 ENCOUNTER — Other Ambulatory Visit (HOSPITAL_COMMUNITY)
Admission: RE | Admit: 2013-03-24 | Discharge: 2013-03-24 | Disposition: A | Discharge status: Home / Self Care | Payer: Medicaid Other | Source: Ambulatory Visit | Attending: Advanced Practice Midwife | Admitting: Advanced Practice Midwife

## 2013-03-24 ENCOUNTER — Encounter: Payer: Self-pay | Admitting: Advanced Practice Midwife

## 2013-03-24 ENCOUNTER — Ambulatory Visit (INDEPENDENT_AMBULATORY_CARE_PROVIDER_SITE_OTHER): Payer: Medicaid Other | Admitting: Advanced Practice Midwife

## 2013-03-24 ENCOUNTER — Other Ambulatory Visit: Payer: Self-pay | Admitting: Advanced Practice Midwife

## 2013-03-24 VITALS — BP 132/58 | Wt 203.5 lb

## 2013-03-24 DIAGNOSIS — Z01419 Encounter for gynecological examination (general) (routine) without abnormal findings: Secondary | ICD-10-CM | POA: Insufficient documentation

## 2013-03-24 DIAGNOSIS — Z3490 Encounter for supervision of normal pregnancy, unspecified, unspecified trimester: Secondary | ICD-10-CM

## 2013-03-24 DIAGNOSIS — Z1389 Encounter for screening for other disorder: Secondary | ICD-10-CM

## 2013-03-24 DIAGNOSIS — Z3482 Encounter for supervision of other normal pregnancy, second trimester: Secondary | ICD-10-CM

## 2013-03-24 DIAGNOSIS — O0932 Supervision of pregnancy with insufficient antenatal care, second trimester: Secondary | ICD-10-CM | POA: Insufficient documentation

## 2013-03-24 DIAGNOSIS — Z331 Pregnant state, incidental: Secondary | ICD-10-CM

## 2013-03-24 DIAGNOSIS — Z113 Encounter for screening for infections with a predominantly sexual mode of transmission: Secondary | ICD-10-CM | POA: Insufficient documentation

## 2013-03-24 DIAGNOSIS — Z3480 Encounter for supervision of other normal pregnancy, unspecified trimester: Secondary | ICD-10-CM

## 2013-03-24 LAB — POCT URINALYSIS DIPSTICK
Blood, UA: NEGATIVE
Protein, UA: NEGATIVE

## 2013-03-24 NOTE — Progress Notes (Signed)
  Subjective:    Sydney Mccall is a [id number] [redacted]w[redacted]d being seen today for her first obstetrical visit.  Her obstetrical history is significant for PPH with baby in october, necessitating a blood transfusion..  She is late to care at 17 weeks.  Sydney Mccall thought her OCP's were making her bloated.  Pregnancy history fully reviewed.  Patient reports no complaints.  Filed Vitals:   03/24/13 1428  BP: 132/58  Weight: 203 lb 8 oz (92.307 kg)    HISTORY: OB History   Grav Para Term Preterm Abortions TAB SAB Ect Mult Living   4 2 2  1  1   2      # Outc Date GA Lbr Len/2nd Wgt Sex Del Anes PTL Lv   1 TRM 2006 [redacted]w[redacted]d 09:00 9lb4oz(4.196kg) M SVD None No Yes   2 TRM 10/13 [redacted]w[redacted]d 08:31 / 00:11 9lb4.5oz(4.21kg) M SVD None  Yes   Comments: pph with blood tx   3 SAB            Comments: early MAB with D&C   4 CUR              Past Medical History  Diagnosis Date  . Genital herpes    Past Surgical History  Procedure Laterality Date  . Breast cyst excision     History reviewed. No pertinent family history.   Exam       Pelvic Exam:    Perineum: Normal Perineum   Vulva: normal   Vagina:  normal mucosa, normal discharge, no palpable nodules   Uterus         Cervix: normal   Adnexa: Not palpable   Urinary:  urethral meatus normal    System: Breast:  normal appearance, no masses or tenderness   Skin: normal coloration and turgor, no rashes    Neurologic: oriented, normal, normal mood   Extremities: normal strength, tone, and muscle mass   HEENT PERRLA   Mouth/Teeth mucous membranes moist, pharynx normal without lesions   Neck supple and no masses   Cardiovascular: regular rate and rhythm   Respiratory:  appears well, vitals normal, no respiratory distress, acyanotic, normal RR   Abdomen: soft, non-tender; bowel sounds normal; no masses,  no organomegaly          Assessment:    Pregnancy: [id number] Patient Active Problem List   Diagnosis Date Noted  . Pregnant 03/24/2013  .  Late prenatal care complicating pregnancy in second trimester 03/24/2013  . Postpartum hemorrhage 07/13/2012  . KNEE JOINT INSTABILITY 08/13/2009        Plan:     Initial labs drawn. Prenatal vitamins. Problem list reviewed and updated. Genetic Screening discussed Quad Screen: requested.  Ultrasound discussed; fetal survey: requested.  Follow up in asap for anatomy scan Mccall,Sydney Husk 03/24/2013

## 2013-03-24 NOTE — Progress Notes (Signed)
New OB packet given. Consents signed.  

## 2013-03-25 ENCOUNTER — Other Ambulatory Visit: Payer: Self-pay | Admitting: Obstetrics & Gynecology

## 2013-03-25 DIAGNOSIS — Z1389 Encounter for screening for other disorder: Secondary | ICD-10-CM

## 2013-03-25 LAB — AFP, QUAD SCREEN
AFP: 44.6 IU/mL
Age Alone: 1:846 {titer}
Down Syndrome Scr Risk Est: 1:13500 {titer}
HCG, Total: 12064 m[IU]/mL
INH: 168.9 pg/mL
MoM for INH: 1.09
Open Spina bifida: NEGATIVE
Osb Risk: 1:18300 {titer}
uE3 Mom: 0.93

## 2013-03-25 LAB — URINALYSIS
Bilirubin Urine: NEGATIVE
Glucose, UA: NEGATIVE mg/dL
Hgb urine dipstick: NEGATIVE
Ketones, ur: NEGATIVE mg/dL
Protein, ur: NEGATIVE mg/dL

## 2013-03-25 LAB — OXYCODONE SCREEN, UA, RFLX CONFIRM: Oxycodone Screen, Ur: NEGATIVE ng/mL

## 2013-03-25 LAB — CBC
HCT: 33.4 % — ABNORMAL LOW (ref 36.0–46.0)
Hemoglobin: 11.2 g/dL — ABNORMAL LOW (ref 12.0–15.0)
MCHC: 33.5 g/dL (ref 30.0–36.0)

## 2013-03-25 LAB — ABO AND RH: Rh Type: POSITIVE

## 2013-03-25 LAB — DRUG SCREEN, URINE, NO CONFIRMATION
Cocaine Metabolites: NEGATIVE
Creatinine,U: 18.4 mg/dL
Opiate Screen, Urine: NEGATIVE
Phencyclidine (PCP): NEGATIVE

## 2013-03-25 LAB — SICKLE CELL SCREEN: Sickle Cell Screen: NEGATIVE

## 2013-03-26 LAB — URINE CULTURE: Colony Count: 50000

## 2013-03-26 LAB — VARICELLA ZOSTER ANTIBODY, IGG: Varicella IgG: 1882 Index — ABNORMAL HIGH (ref <135.00)

## 2013-03-28 LAB — CYSTIC FIBROSIS DIAGNOSTIC STUDY

## 2013-03-29 ENCOUNTER — Ambulatory Visit (INDEPENDENT_AMBULATORY_CARE_PROVIDER_SITE_OTHER): Payer: Medicaid Other | Admitting: Obstetrics & Gynecology

## 2013-03-29 ENCOUNTER — Ambulatory Visit (INDEPENDENT_AMBULATORY_CARE_PROVIDER_SITE_OTHER): Payer: Medicaid Other

## 2013-03-29 ENCOUNTER — Encounter: Payer: Self-pay | Admitting: Obstetrics & Gynecology

## 2013-03-29 VITALS — BP 140/80 | Wt 200.0 lb

## 2013-03-29 DIAGNOSIS — Z1389 Encounter for screening for other disorder: Secondary | ICD-10-CM

## 2013-03-29 DIAGNOSIS — F1920 Other psychoactive substance dependence, uncomplicated: Secondary | ICD-10-CM

## 2013-03-29 DIAGNOSIS — O9989 Other specified diseases and conditions complicating pregnancy, childbirth and the puerperium: Secondary | ICD-10-CM

## 2013-03-29 DIAGNOSIS — J020 Streptococcal pharyngitis: Secondary | ICD-10-CM

## 2013-03-29 DIAGNOSIS — Z363 Encounter for antenatal screening for malformations: Secondary | ICD-10-CM

## 2013-03-29 DIAGNOSIS — Z331 Pregnant state, incidental: Secondary | ICD-10-CM

## 2013-03-29 LAB — POCT URINALYSIS DIPSTICK
Blood, UA: NEGATIVE
Leukocytes, UA: NEGATIVE
Nitrite, UA: NEGATIVE
Protein, UA: NEGATIVE

## 2013-03-29 MED ORDER — AMOXICILLIN-POT CLAVULANATE 250-62.5 MG/5ML PO SUSR: ORAL | Status: DC

## 2013-03-29 NOTE — Patient Instructions (Addendum)
Strep Infections Streptococcal (strep) infections are caused by streptococcal germs (bacteria). Strep infections are very contagious. Strep infections can occur in:  Ears.  The nose.  The throat.  Sinuses.  Skin.  Blood.  Lungs.  Spinal fluid.  Urine. Strep throat is the most common bacterial infection in children. The symptoms of a Strep infection usually get better in 2 to 3 days after starting medicine that kills germs (antibiotics). Strep is usually not contagious after 36 to 48 hours of antibiotic treatment. Strep infections that are not treated can cause serious complications. These include gland infections, throat abscess, rheumatic fever and kidney disease. DIAGNOSIS  The diagnosis of strep is made by:  A culture for the strep germ. TREATMENT  These infections require oral antibiotics for a full 10 days, an antibiotic shot or antibiotics given into the vein (intravenous, IV). HOME CARE INSTRUCTIONS   Be sure to finish all antibiotics even if feeling better.  Only take over-the-counter medicines for pain, discomfort and or fever, as directed by your caregiver.  Close contacts that have a fever, sore throat or illness symptoms should see their caregiver right away.  You or your child may return to work, school or daycare if the fever and pain are better in 2 to 3 days after starting antibiotics. SEEK MEDICAL CARE IF:   You or your child has an oral temperature above 102 F (38.9 C).  Your baby is older than 3 months with a rectal temperature of 100.5 F (38.1 C) or higher for more than 1 day.  You or your child is not better in 3 days. SEEK IMMEDIATE MEDICAL CARE IF:   You or your child has an oral temperature above 102 F (38.9 C), not controlled by medicine.  Your baby is older than 3 months with a rectal temperature of 102 F (38.9 C) or higher.  Your baby is 3 months old or younger with a rectal temperature of 100.4 F (38 C) or higher.  There is a  spreading rash.  There is difficulty swallowing or breathing.  There is increased pain or swelling. Document Released: 10/30/2004 Document Revised: 12/15/2011 Document Reviewed: 08/08/2009 Exit[employer] Patient Information 2014 [employer].  

## 2013-03-29 NOTE — Progress Notes (Signed)
U/S (19+5wks)- active fetus, meas c/w previous u/s dates, post gr 0 plac, cx long and closed 3.57cm, fluid wnl, bilateral adnexa wnl, no major abnl noted, female fetus

## 2013-03-29 NOTE — Progress Notes (Signed)
C/C SORE THROAT,EARS AND HEAD HURTING.

## 2013-03-29 NOTE — Progress Notes (Signed)
Strep throat  Augmentin liquid 500 mg TID BP weight and urine results all reviewed and noted. Patient reports good fetal movement, denies any bleeding and no rupture of membranes symptoms or regular contractions. Patient is without complaints. All questions were answered. Sonogram report noted and done

## 2013-04-21 ENCOUNTER — Ambulatory Visit (INDEPENDENT_AMBULATORY_CARE_PROVIDER_SITE_OTHER): Payer: Medicaid Other | Admitting: Advanced Practice Midwife

## 2013-04-21 ENCOUNTER — Encounter: Payer: Self-pay | Admitting: Advanced Practice Midwife

## 2013-04-21 VITALS — BP 112/50 | Wt 202.0 lb

## 2013-04-21 DIAGNOSIS — O99019 Anemia complicating pregnancy, unspecified trimester: Secondary | ICD-10-CM

## 2013-04-21 DIAGNOSIS — O0930 Supervision of pregnancy with insufficient antenatal care, unspecified trimester: Secondary | ICD-10-CM

## 2013-04-21 DIAGNOSIS — O09299 Supervision of pregnancy with other poor reproductive or obstetric history, unspecified trimester: Secondary | ICD-10-CM

## 2013-04-21 DIAGNOSIS — Z1389 Encounter for screening for other disorder: Secondary | ICD-10-CM

## 2013-04-21 DIAGNOSIS — Z331 Pregnant state, incidental: Secondary | ICD-10-CM

## 2013-04-21 DIAGNOSIS — O0932 Supervision of pregnancy with insufficient antenatal care, second trimester: Secondary | ICD-10-CM

## 2013-04-21 LAB — POCT URINALYSIS DIPSTICK
Blood, UA: NEGATIVE
Nitrite, UA: NEGATIVE

## 2013-04-21 NOTE — Patient Instructions (Signed)
Nothing to eat or drink after midnight before your next appointment & plan to be here for 2 hours (for your sugar test).  

## 2013-04-21 NOTE — Progress Notes (Signed)
Pt states feels "bump in vaginal area".  Looks like a varicose vein.  Also has a tiny area near buttocks that looks like a furuncle.  Advice given. Routine questions about pregnancy answered.  F/U in 4 weeks for LRob/pn2 .

## 2013-05-20 ENCOUNTER — Ambulatory Visit (INDEPENDENT_AMBULATORY_CARE_PROVIDER_SITE_OTHER): Payer: Medicaid Other | Admitting: Obstetrics & Gynecology

## 2013-05-20 ENCOUNTER — Other Ambulatory Visit (INDEPENDENT_AMBULATORY_CARE_PROVIDER_SITE_OTHER): Payer: Medicaid Other

## 2013-05-20 ENCOUNTER — Encounter: Payer: Self-pay | Admitting: Obstetrics & Gynecology

## 2013-05-20 VITALS — BP 120/60 | Wt 203.0 lb

## 2013-05-20 DIAGNOSIS — O0930 Supervision of pregnancy with insufficient antenatal care, unspecified trimester: Secondary | ICD-10-CM

## 2013-05-20 DIAGNOSIS — Z1389 Encounter for screening for other disorder: Secondary | ICD-10-CM

## 2013-05-20 DIAGNOSIS — Z331 Pregnant state, incidental: Secondary | ICD-10-CM

## 2013-05-20 DIAGNOSIS — O99019 Anemia complicating pregnancy, unspecified trimester: Secondary | ICD-10-CM

## 2013-05-20 DIAGNOSIS — Z3480 Encounter for supervision of other normal pregnancy, unspecified trimester: Secondary | ICD-10-CM

## 2013-05-20 DIAGNOSIS — O09299 Supervision of pregnancy with other poor reproductive or obstetric history, unspecified trimester: Secondary | ICD-10-CM

## 2013-05-20 LAB — POCT URINALYSIS DIPSTICK
Blood, UA: NEGATIVE
Glucose, UA: NEGATIVE
Nitrite, UA: NEGATIVE

## 2013-05-20 NOTE — Progress Notes (Signed)
BP weight and urine results all reviewed and noted. Patient reports good fetal movement, denies any bleeding and no rupture of membranes symptoms or regular contractions. Patient is without complaints. All questions were answered.  

## 2013-05-20 NOTE — Progress Notes (Signed)
FOR PN2 TODAY. 

## 2013-05-21 LAB — ANTIBODY SCREEN: Antibody Screen: NEGATIVE

## 2013-05-21 LAB — CBC
MCH: 28.1 pg (ref 26.0–34.0)
MCHC: 32.7 g/dL (ref 30.0–36.0)
MCV: 85.9 fL (ref 78.0–100.0)
Platelets: 148 10*3/uL — ABNORMAL LOW (ref 150–400)
RBC: 3.77 MIL/uL — ABNORMAL LOW (ref 3.87–5.11)

## 2013-05-21 LAB — GLUCOSE TOLERANCE, 2 HOURS W/ 1HR: Glucose, 1 hour: 121 mg/dL (ref 70–170)

## 2013-05-21 LAB — RPR

## 2013-05-23 ENCOUNTER — Encounter: Payer: Self-pay | Admitting: Obstetrics & Gynecology

## 2013-05-23 DIAGNOSIS — B009 Herpesviral infection, unspecified: Secondary | ICD-10-CM | POA: Insufficient documentation

## 2013-05-23 DIAGNOSIS — O98519 Other viral diseases complicating pregnancy, unspecified trimester: Secondary | ICD-10-CM

## 2013-05-23 HISTORY — DX: Herpesviral infection, unspecified: B00.9

## 2013-06-09 ENCOUNTER — Ambulatory Visit (INDEPENDENT_AMBULATORY_CARE_PROVIDER_SITE_OTHER): Payer: Medicaid Other | Admitting: Advanced Practice Midwife

## 2013-06-09 ENCOUNTER — Encounter: Payer: Self-pay | Admitting: Advanced Practice Midwife

## 2013-06-09 VITALS — BP 110/60 | Wt 208.0 lb

## 2013-06-09 DIAGNOSIS — O09299 Supervision of pregnancy with other poor reproductive or obstetric history, unspecified trimester: Secondary | ICD-10-CM

## 2013-06-09 DIAGNOSIS — Z3490 Encounter for supervision of normal pregnancy, unspecified, unspecified trimester: Secondary | ICD-10-CM

## 2013-06-09 DIAGNOSIS — O99019 Anemia complicating pregnancy, unspecified trimester: Secondary | ICD-10-CM

## 2013-06-09 DIAGNOSIS — O0930 Supervision of pregnancy with insufficient antenatal care, unspecified trimester: Secondary | ICD-10-CM

## 2013-06-09 DIAGNOSIS — Z1389 Encounter for screening for other disorder: Secondary | ICD-10-CM

## 2013-06-09 DIAGNOSIS — Z331 Pregnant state, incidental: Secondary | ICD-10-CM

## 2013-06-09 LAB — POCT URINALYSIS DIPSTICK: Nitrite, UA: NEGATIVE

## 2013-06-09 NOTE — Progress Notes (Signed)
Pt taking acyclovir already to prevent transmission to her seronegative partner, but only taking it once or twice a day.  Pt very upset that we haven't visited this issue before.  Discussed at length.  Will start acyclovir 400mg  TID.  (pt to request refill when current bottle runs out).    No c/o at this time.  Routine questions about pregnancy answered.  F/U in 2 weeks for LROB.

## 2013-06-14 ENCOUNTER — Emergency Department (HOSPITAL_COMMUNITY)
Admission: EM | Admit: 2013-06-14 | Discharge: 2013-06-14 | Disposition: A | Discharge status: Home / Self Care | Payer: Medicaid Other | Attending: Emergency Medicine | Admitting: Emergency Medicine

## 2013-06-14 ENCOUNTER — Encounter (HOSPITAL_COMMUNITY): Payer: Self-pay

## 2013-06-14 DIAGNOSIS — Y92410 Unspecified street and highway as the place of occurrence of the external cause: Secondary | ICD-10-CM | POA: Insufficient documentation

## 2013-06-14 DIAGNOSIS — Z8619 Personal history of other infectious and parasitic diseases: Secondary | ICD-10-CM | POA: Insufficient documentation

## 2013-06-14 DIAGNOSIS — Z79899 Other long term (current) drug therapy: Secondary | ICD-10-CM | POA: Insufficient documentation

## 2013-06-14 DIAGNOSIS — O9989 Other specified diseases and conditions complicating pregnancy, childbirth and the puerperium: Secondary | ICD-10-CM | POA: Insufficient documentation

## 2013-06-14 DIAGNOSIS — IMO0002 Reserved for concepts with insufficient information to code with codable children: Secondary | ICD-10-CM | POA: Insufficient documentation

## 2013-06-14 DIAGNOSIS — Y9389 Activity, other specified: Secondary | ICD-10-CM | POA: Insufficient documentation

## 2013-06-14 HISTORY — DX: Encounter for supervision of normal pregnancy, unspecified, unspecified trimester: Z34.90
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
## 2013-06-15 ENCOUNTER — Emergency Department (HOSPITAL_COMMUNITY): Payer: No Typology Code available for payment source

## 2013-06-15 ENCOUNTER — Emergency Department (HOSPITAL_COMMUNITY)
Admission: EM | Admit: 2013-06-15 | Discharge: 2013-06-15 | Disposition: A | Discharge status: Home / Self Care | Payer: No Typology Code available for payment source | Attending: Emergency Medicine | Admitting: Emergency Medicine

## 2013-06-15 ENCOUNTER — Encounter (HOSPITAL_COMMUNITY): Payer: Self-pay

## 2013-06-15 ENCOUNTER — Telehealth: Payer: Self-pay | Admitting: *Deleted

## 2013-06-15 DIAGNOSIS — R109 Unspecified abdominal pain: Secondary | ICD-10-CM

## 2013-06-15 DIAGNOSIS — Z3490 Encounter for supervision of normal pregnancy, unspecified, unspecified trimester: Secondary | ICD-10-CM

## 2013-06-15 DIAGNOSIS — R10A2 Flank pain, left side: Secondary | ICD-10-CM

## 2013-06-15 DIAGNOSIS — Y92410 Unspecified street and highway as the place of occurrence of the external cause: Secondary | ICD-10-CM | POA: Insufficient documentation

## 2013-06-15 DIAGNOSIS — Z8619 Personal history of other infectious and parasitic diseases: Secondary | ICD-10-CM | POA: Insufficient documentation

## 2013-06-15 DIAGNOSIS — O9989 Other specified diseases and conditions complicating pregnancy, childbirth and the puerperium: Secondary | ICD-10-CM | POA: Insufficient documentation

## 2013-06-15 DIAGNOSIS — Y9389 Activity, other specified: Secondary | ICD-10-CM | POA: Insufficient documentation

## 2013-06-15 DIAGNOSIS — O0932 Supervision of pregnancy with insufficient antenatal care, second trimester: Secondary | ICD-10-CM

## 2013-06-15 DIAGNOSIS — Z79899 Other long term (current) drug therapy: Secondary | ICD-10-CM | POA: Insufficient documentation

## 2013-06-15 LAB — URINALYSIS, ROUTINE W REFLEX MICROSCOPIC
Glucose, UA: 500 mg/dL — AB
Leukocytes, UA: NEGATIVE
Nitrite: NEGATIVE
Protein, ur: NEGATIVE mg/dL
Urobilinogen, UA: 0.2 mg/dL (ref 0.0–1.0)

## 2013-06-15 MED ORDER — HYDROCODONE-ACETAMINOPHEN 5-325 MG PO TABS: 1.0000 | ORAL_TABLET | Freq: Four times a day (QID) | ORAL | Status: DC | PRN

## 2013-06-15 NOTE — Progress Notes (Signed)
Patient taken off EFM to go to U/S 
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
## 2013-06-15 NOTE — Progress Notes (Deleted)
Patient taken off EFM to go to U/S

## 2013-06-15 NOTE — Progress Notes (Signed)
Spoke with nurse at [facility] and stated patient would probably be going home
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
## 2013-06-15 NOTE — ED Notes (Signed)
Patient ultrasound back. [facility] called about monitoring. Per staff fetal heart rate and activity looks good, patient can be discharged off toco monitor. Dr Lynelle Doctor made aware.
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
## 2013-06-20 NOTE — ED Provider Notes (Signed)
Medical screening examination/treatment/procedure(s) were performed by non-physician practitioner and as supervising physician I was immediately available for consultation/collaboration.   Benny Lennert, MD 06/20/13 [phone]

## 2013-06-23 ENCOUNTER — Ambulatory Visit (INDEPENDENT_AMBULATORY_CARE_PROVIDER_SITE_OTHER): Payer: Medicaid Other | Admitting: Advanced Practice Midwife

## 2013-06-23 ENCOUNTER — Encounter: Payer: Self-pay | Admitting: Advanced Practice Midwife

## 2013-06-23 VITALS — BP 120/60 | Wt 208.0 lb

## 2013-06-23 DIAGNOSIS — O99019 Anemia complicating pregnancy, unspecified trimester: Secondary | ICD-10-CM

## 2013-06-23 DIAGNOSIS — Z331 Pregnant state, incidental: Secondary | ICD-10-CM

## 2013-06-23 DIAGNOSIS — O09299 Supervision of pregnancy with other poor reproductive or obstetric history, unspecified trimester: Secondary | ICD-10-CM

## 2013-06-23 DIAGNOSIS — O0930 Supervision of pregnancy with insufficient antenatal care, unspecified trimester: Secondary | ICD-10-CM

## 2013-06-23 DIAGNOSIS — Z1389 Encounter for screening for other disorder: Secondary | ICD-10-CM

## 2013-06-23 LAB — POCT URINALYSIS DIPSTICK
Blood, UA: NEGATIVE
Nitrite, UA: NEGATIVE

## 2013-06-23 MED ORDER — CYCLOBENZAPRINE HCL 10 MG PO TABS: 10.0000 mg | ORAL_TABLET | Freq: Three times a day (TID) | ORAL | Status: DC | PRN

## 2013-06-23 NOTE — Patient Instructions (Signed)
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
## 2013-07-07 ENCOUNTER — Encounter: Payer: Self-pay | Admitting: Advanced Practice Midwife

## 2013-07-07 ENCOUNTER — Ambulatory Visit (INDEPENDENT_AMBULATORY_CARE_PROVIDER_SITE_OTHER): Payer: Medicaid Other | Admitting: Advanced Practice Midwife

## 2013-07-07 VITALS — BP 108/60 | Wt 208.0 lb

## 2013-07-07 DIAGNOSIS — O0932 Supervision of pregnancy with insufficient antenatal care, second trimester: Secondary | ICD-10-CM

## 2013-07-07 DIAGNOSIS — O0930 Supervision of pregnancy with insufficient antenatal care, unspecified trimester: Secondary | ICD-10-CM

## 2013-07-07 DIAGNOSIS — Z1389 Encounter for screening for other disorder: Secondary | ICD-10-CM

## 2013-07-07 DIAGNOSIS — B009 Herpesviral infection, unspecified: Secondary | ICD-10-CM

## 2013-07-07 DIAGNOSIS — O09299 Supervision of pregnancy with other poor reproductive or obstetric history, unspecified trimester: Secondary | ICD-10-CM

## 2013-07-07 DIAGNOSIS — O99019 Anemia complicating pregnancy, unspecified trimester: Secondary | ICD-10-CM

## 2013-07-07 DIAGNOSIS — O98519 Other viral diseases complicating pregnancy, unspecified trimester: Secondary | ICD-10-CM

## 2013-07-07 DIAGNOSIS — Z331 Pregnant state, incidental: Secondary | ICD-10-CM

## 2013-07-07 LAB — POCT URINALYSIS DIPSTICK
Blood, UA: NEGATIVE
Protein, UA: NEGATIVE

## 2013-07-07 MED ORDER — ACYCLOVIR 400 MG PO TABS: 400.0000 mg | ORAL_TABLET | ORAL | Status: DC

## 2013-07-07 NOTE — Progress Notes (Signed)
Pt here today for routine visit. Pt denies any problems or concerns at this time. Feels better, aches and pains now rare.   No c/o at this time.  Routine questions about pregnancy answered.  F/U in 2 weeks for LROB.

## 2013-07-11 ENCOUNTER — Inpatient Hospital Stay (HOSPITAL_COMMUNITY)
Admission: AD | Admit: 2013-07-11 | Discharge: 2013-07-11 | Disposition: A | Discharge status: Home / Self Care | Payer: Medicaid Other | Source: Ambulatory Visit | Attending: Obstetrics & Gynecology | Admitting: Obstetrics & Gynecology

## 2013-07-11 ENCOUNTER — Encounter (HOSPITAL_COMMUNITY): Payer: Self-pay

## 2013-07-11 DIAGNOSIS — R42 Dizziness and giddiness: Secondary | ICD-10-CM

## 2013-07-11 DIAGNOSIS — R0602 Shortness of breath: Secondary | ICD-10-CM | POA: Insufficient documentation

## 2013-07-11 DIAGNOSIS — O99891 Other specified diseases and conditions complicating pregnancy: Secondary | ICD-10-CM

## 2013-07-11 LAB — URINALYSIS, ROUTINE W REFLEX MICROSCOPIC
Glucose, UA: >1000 mg/dL — AB
Hgb urine dipstick: NEGATIVE
Ketones, ur: 15 mg/dL — AB
Protein, ur: NEGATIVE mg/dL

## 2013-07-11 LAB — URINE MICROSCOPIC-ADD ON

## 2013-07-11 NOTE — MAU Note (Signed)
Patient is in with c/o shortness of breath since yesterday. She denies contraction, vaginal bleeding or lof. She reports good fetal movement. Breath sounds are clear, diminished at bases.

## 2013-07-11 NOTE — MAU Note (Signed)
Patient states that she has periods of having shortness of breath when she is working. This has been happening for about one week. Denies contractions, bleeding or leaking and reports good fetal movement.

## 2013-07-11 NOTE — MAU Provider Note (Signed)
History     CSN: [id number]  Arrival date and time: 07/11/13 1343   None     Chief Complaint  Patient presents with  . Shortness of Breath   HPI Sydney Mccall is a [redacted]w[redacted]d pregnant [phone] that presents with 1 week history of SOB. She says that it happens when she is at her job at [employer]. Patient was asked if the "SOB" is like she can't catch her breath or if it was more lightheadedness. She confirmed it was the latter. Patient says that the lightheadedness resolves when she sits down and rests. She denies having it at home or at her other job. She denies any associated CP, N/V, palpitations, diaphoresis, arm or jaw pain. Patient did not have this problem during her previous pregnancies. She denies any other complaints.   OB History   Grav Para Term Preterm Abortions TAB SAB Ect Mult Living   4 2 2  1  1   2       Past Medical History  Diagnosis Date  . Genital herpes   . Pregnant     Past Surgical History  Procedure Laterality Date  . Breast cyst excision      History reviewed. No pertinent family history.  History  Substance Use Topics  . Smoking status: Never Smoker   . Smokeless tobacco: Never Used  . Alcohol Use: No    Allergies:  Allergies  Allergen Reactions  . Iodine Anaphylaxis and Swelling    Throat itchy and swells     Prescriptions prior to admission  Medication Sig Dispense Refill  . acyclovir (ZOVIRAX) 400 MG tablet Take 1 tablet (400 mg total) by mouth every 4 (four) hours while awake.  90 tablet  6  . cyclobenzaprine (FLEXERIL) 10 MG tablet Take 1 tablet (10 mg total) by mouth every 8 (eight) hours as needed for muscle spasms.  30 tablet  1  . Prenat w/o A-FeCbn-DSS-FA-DHA (CITRANATAL HARMONY PO) Take by mouth daily.      Marland Kitchen HYDROcodone-acetaminophen (NORCO/VICODIN) 5-325 MG per tablet Take 1 tablet by mouth every 6 (six) hours as needed for pain.  10 tablet  0    Review of Systems  Constitutional: Negative for fever and chills.  HENT: Negative for  sore throat.   Respiratory: Negative for cough and wheezing.   Cardiovascular: Negative for chest pain and palpitations.  Gastrointestinal: Negative for heartburn, nausea, vomiting and abdominal pain.  Genitourinary: Negative for dysuria, urgency and frequency.  Skin: Negative.  Negative for itching and rash.  Neurological: Negative for weakness and headaches.   Physical Exam   Blood pressure 110/60, pulse 112, temperature 99.3 F (37.4 C), temperature source Oral, resp. rate 18, height 5' 4.5" (1.638 m), weight 96.163 kg (212 lb), last menstrual period 08/07/2011, SpO2 99.00%.  Physical Exam  Constitutional: She is oriented to person, place, and time. She appears well-developed and well-nourished. No distress.  HENT:  Head: Normocephalic and atraumatic.  Cardiovascular: Normal rate, regular rhythm, normal heart sounds and intact distal pulses.   Respiratory: Effort normal and breath sounds normal.  Neurological: She is alert and oriented to person, place, and time.  Skin: Skin is warm and dry. No rash noted. She is not diaphoretic. No erythema. No pallor.  Psychiatric: She has a normal mood and affect.    MAU Course  Procedures  MDM  Assessment and Plan   Patient is a gravid female who is complaining of exertional lightheadedness. She only complains of this feeling while working at  [employer]. She denies associated symptoms. She has no symptoms or signs of DVT.  Patient states that she remains well hydrated and eats well.  Since symptoms only present while patient it is likely an intolerance to temperature or air bourne particulate specific to [facility]. Will give patient a note for work accomodation, recommending frequent breaks to get fresh air and an emphasis on hydration.  Sydney Mccall 07/11/2013, 3:29 PM   I have seen and examined this patient and agree with above documentation in the PA student's note. Pt states that for the last few days, when she works at [employer] she has 1-2 episodes  of lightheadedness during her shift that require her to sit down.  These do not happen at home or her other job. She does not want to leave her job but wondering if there is anything we can do.  Discussed increasing water intake at work and taking quick breaks outside to avoid overheating. 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 Given her symptoms only occur at this job, I suspect it to be heat or hydration related. F/u as scheduled in [facility] or return here if symptoms worsen. FWB- cat I tracing.   Rulon Abide, M.D. [facility] Fellow 07/11/2013 6:24 PM

## 2013-07-11 NOTE — Progress Notes (Signed)
Dr Claiborne Billings notified of patient and her complains

## 2013-07-11 NOTE — MAU Provider Note (Signed)
Attestation of Attending Supervision of Fellow: Evaluation and management procedures were performed by the Fellow under my supervision and collaboration.  I have reviewed the Fellow's note and chart, and I agree with the management and plan.    

## 2013-07-20 DIAGNOSIS — Z029 Encounter for administrative examinations, unspecified: Secondary | ICD-10-CM

## 2013-07-22 ENCOUNTER — Encounter: Payer: Self-pay | Admitting: Obstetrics & Gynecology

## 2013-07-22 ENCOUNTER — Ambulatory Visit (INDEPENDENT_AMBULATORY_CARE_PROVIDER_SITE_OTHER): Payer: Medicaid Other | Admitting: Obstetrics & Gynecology

## 2013-07-22 VITALS — BP 100/60 | Wt 212.0 lb

## 2013-07-22 DIAGNOSIS — Z1389 Encounter for screening for other disorder: Secondary | ICD-10-CM

## 2013-07-22 DIAGNOSIS — O09299 Supervision of pregnancy with other poor reproductive or obstetric history, unspecified trimester: Secondary | ICD-10-CM

## 2013-07-22 DIAGNOSIS — Z3483 Encounter for supervision of other normal pregnancy, third trimester: Secondary | ICD-10-CM

## 2013-07-22 DIAGNOSIS — O98519 Other viral diseases complicating pregnancy, unspecified trimester: Secondary | ICD-10-CM

## 2013-07-22 DIAGNOSIS — O99019 Anemia complicating pregnancy, unspecified trimester: Secondary | ICD-10-CM

## 2013-07-22 DIAGNOSIS — Z331 Pregnant state, incidental: Secondary | ICD-10-CM

## 2013-07-22 DIAGNOSIS — O0930 Supervision of pregnancy with insufficient antenatal care, unspecified trimester: Secondary | ICD-10-CM

## 2013-07-22 LAB — POCT URINALYSIS DIPSTICK
Ketones, UA: NEGATIVE
Nitrite, UA: NEGATIVE

## 2013-07-22 NOTE — Progress Notes (Signed)
BP weight and urine results all reviewed and noted. Patient reports good fetal movement, denies any bleeding and no rupture of membranes symptoms or regular contractions. Patient is without complaints. All questions were answered.  

## 2013-07-22 NOTE — Progress Notes (Signed)
PAIN IN PELVIC AREA.

## 2013-07-22 NOTE — Progress Notes (Signed)
Pt here today for routine visit.

## 2013-07-28 ENCOUNTER — Ambulatory Visit (INDEPENDENT_AMBULATORY_CARE_PROVIDER_SITE_OTHER): Payer: Medicaid Other | Admitting: Advanced Practice Midwife

## 2013-07-28 ENCOUNTER — Encounter (INDEPENDENT_AMBULATORY_CARE_PROVIDER_SITE_OTHER): Payer: Self-pay

## 2013-07-28 ENCOUNTER — Encounter: Payer: Self-pay | Admitting: Advanced Practice Midwife

## 2013-07-28 VITALS — BP 120/60 | Wt 214.0 lb

## 2013-07-28 DIAGNOSIS — Z1389 Encounter for screening for other disorder: Secondary | ICD-10-CM

## 2013-07-28 DIAGNOSIS — O0930 Supervision of pregnancy with insufficient antenatal care, unspecified trimester: Secondary | ICD-10-CM

## 2013-07-28 DIAGNOSIS — O99019 Anemia complicating pregnancy, unspecified trimester: Secondary | ICD-10-CM

## 2013-07-28 DIAGNOSIS — Z331 Pregnant state, incidental: Secondary | ICD-10-CM

## 2013-07-28 DIAGNOSIS — O09299 Supervision of pregnancy with other poor reproductive or obstetric history, unspecified trimester: Secondary | ICD-10-CM

## 2013-07-28 DIAGNOSIS — O98519 Other viral diseases complicating pregnancy, unspecified trimester: Secondary | ICD-10-CM

## 2013-07-28 DIAGNOSIS — Z3483 Encounter for supervision of other normal pregnancy, third trimester: Secondary | ICD-10-CM

## 2013-07-28 LAB — POCT URINALYSIS DIPSTICK
Leukocytes, UA: NEGATIVE
Nitrite, UA: NEGATIVE

## 2013-07-28 LAB — OB RESULTS CONSOLE GC/CHLAMYDIA: Gonorrhea: NEGATIVE

## 2013-07-28 NOTE — Progress Notes (Signed)
C/o pelvic pressure for a week, "a little more" this morning.  Labor precautions.    No c/o at this time.  Routine questions about pregnancy answered.  F/U in 1 weeks for LROB.

## 2013-07-28 NOTE — Progress Notes (Signed)
Had sweet tea to drink this morning.

## 2013-07-28 NOTE — Addendum Note (Signed)
Addended by: Jacklyn Shell on: 07/28/2013 11:14 AM   Modules accepted: Orders

## 2013-07-30 LAB — STREP B DNA PROBE: GBSP: NEGATIVE

## 2013-08-03 ENCOUNTER — Encounter: Payer: Self-pay | Admitting: Advanced Practice Midwife

## 2013-08-03 ENCOUNTER — Ambulatory Visit (INDEPENDENT_AMBULATORY_CARE_PROVIDER_SITE_OTHER): Payer: Medicaid Other | Admitting: Advanced Practice Midwife

## 2013-08-03 VITALS — BP 112/58 | Wt 213.5 lb

## 2013-08-03 DIAGNOSIS — Z1389 Encounter for screening for other disorder: Secondary | ICD-10-CM

## 2013-08-03 DIAGNOSIS — O98519 Other viral diseases complicating pregnancy, unspecified trimester: Secondary | ICD-10-CM

## 2013-08-03 DIAGNOSIS — O09299 Supervision of pregnancy with other poor reproductive or obstetric history, unspecified trimester: Secondary | ICD-10-CM

## 2013-08-03 DIAGNOSIS — Z331 Pregnant state, incidental: Secondary | ICD-10-CM

## 2013-08-03 DIAGNOSIS — O99019 Anemia complicating pregnancy, unspecified trimester: Secondary | ICD-10-CM

## 2013-08-03 DIAGNOSIS — O0930 Supervision of pregnancy with insufficient antenatal care, unspecified trimester: Secondary | ICD-10-CM

## 2013-08-03 LAB — POCT URINALYSIS DIPSTICK
Blood, UA: NEGATIVE
Ketones, UA: NEGATIVE

## 2013-08-03 NOTE — Progress Notes (Signed)
No c/o at this time. Offered membrane sweeping next week, but states she will probably just wait until her due date.  GBS neg.  Routine questions about pregnancy answered.  F/U in 1 weeks for LROB.

## 2013-08-10 ENCOUNTER — Ambulatory Visit (INDEPENDENT_AMBULATORY_CARE_PROVIDER_SITE_OTHER): Payer: Medicaid Other | Admitting: Advanced Practice Midwife

## 2013-08-10 ENCOUNTER — Encounter (INDEPENDENT_AMBULATORY_CARE_PROVIDER_SITE_OTHER): Payer: Self-pay

## 2013-08-10 ENCOUNTER — Encounter: Payer: Self-pay | Admitting: Advanced Practice Midwife

## 2013-08-10 VITALS — BP 120/60 | Wt 212.0 lb

## 2013-08-10 DIAGNOSIS — O98519 Other viral diseases complicating pregnancy, unspecified trimester: Secondary | ICD-10-CM

## 2013-08-10 DIAGNOSIS — Z1389 Encounter for screening for other disorder: Secondary | ICD-10-CM

## 2013-08-10 DIAGNOSIS — Z331 Pregnant state, incidental: Secondary | ICD-10-CM

## 2013-08-10 DIAGNOSIS — O0930 Supervision of pregnancy with insufficient antenatal care, unspecified trimester: Secondary | ICD-10-CM

## 2013-08-10 DIAGNOSIS — O99019 Anemia complicating pregnancy, unspecified trimester: Secondary | ICD-10-CM

## 2013-08-10 DIAGNOSIS — O09299 Supervision of pregnancy with other poor reproductive or obstetric history, unspecified trimester: Secondary | ICD-10-CM

## 2013-08-10 LAB — POCT URINALYSIS DIPSTICK
Blood, UA: NEGATIVE
Ketones, UA: NEGATIVE

## 2013-08-10 NOTE — Progress Notes (Signed)
Ate Lucky Charms right before coming to appt.    No c/o at this time.  Routine questions about pregnancy answered.  F/U in 1 weeks for LROB.

## 2013-08-18 ENCOUNTER — Encounter: Payer: Self-pay | Admitting: Advanced Practice Midwife

## 2013-08-18 ENCOUNTER — Ambulatory Visit (INDEPENDENT_AMBULATORY_CARE_PROVIDER_SITE_OTHER): Payer: Medicaid Other | Admitting: Advanced Practice Midwife

## 2013-08-18 VITALS — BP 128/60 | Wt 216.5 lb

## 2013-08-18 DIAGNOSIS — O98519 Other viral diseases complicating pregnancy, unspecified trimester: Secondary | ICD-10-CM

## 2013-08-18 DIAGNOSIS — O99019 Anemia complicating pregnancy, unspecified trimester: Secondary | ICD-10-CM

## 2013-08-18 DIAGNOSIS — O09299 Supervision of pregnancy with other poor reproductive or obstetric history, unspecified trimester: Secondary | ICD-10-CM

## 2013-08-18 DIAGNOSIS — Z1389 Encounter for screening for other disorder: Secondary | ICD-10-CM

## 2013-08-18 DIAGNOSIS — O0930 Supervision of pregnancy with insufficient antenatal care, unspecified trimester: Secondary | ICD-10-CM

## 2013-08-18 DIAGNOSIS — Z331 Pregnant state, incidental: Secondary | ICD-10-CM

## 2013-08-18 LAB — POCT URINALYSIS DIPSTICK
Glucose, UA: NEGATIVE
Leukocytes, UA: NEGATIVE

## 2013-08-18 NOTE — Progress Notes (Signed)
Membranes swept. IOL   No c/o at this time.  Routine questions about pregnancy answered.  F/U in 1 weeks for LROB.

## 2013-08-19 ENCOUNTER — Telehealth: Payer: Self-pay | Admitting: Obstetrics & Gynecology

## 2013-08-19 NOTE — Telephone Encounter (Signed)
Pt states had membranes stripped yesterday by Drenda Freeze, not satisfied with results, wants membranes re stripped by Eure today. Pt also states does not want to be induced on Saturday. Per Dr. Despina Hidden, will not strip membranes again for 1 week, pt to keep appt for induction this Saturday. Pt encouraged to be pt per Dr. Despina Hidden. Pt verbalized understanding.

## 2013-08-23 ENCOUNTER — Inpatient Hospital Stay (HOSPITAL_COMMUNITY)
Admission: AD | Admit: 2013-08-23 | Discharge: 2013-08-25 | DRG: 767 | Disposition: A | Discharge status: Home / Self Care | Payer: Medicaid Other | Source: Ambulatory Visit | Attending: Obstetrics & Gynecology | Admitting: Obstetrics & Gynecology

## 2013-08-23 ENCOUNTER — Encounter (HOSPITAL_COMMUNITY): Payer: Self-pay | Admitting: *Deleted

## 2013-08-23 DIAGNOSIS — O0930 Supervision of pregnancy with insufficient antenatal care, unspecified trimester: Secondary | ICD-10-CM

## 2013-08-23 DIAGNOSIS — O4290 Premature rupture of membranes, unspecified as to length of time between rupture and onset of labor, unspecified weeks of gestation: Principal | ICD-10-CM | POA: Diagnosis present

## 2013-08-23 DIAGNOSIS — O0932 Supervision of pregnancy with insufficient antenatal care, second trimester: Secondary | ICD-10-CM

## 2013-08-23 DIAGNOSIS — Z302 Encounter for sterilization: Secondary | ICD-10-CM

## 2013-08-23 HISTORY — DX: Herpesviral infection of urogenital system, unspecified: A60.00

## 2013-08-23 LAB — RPR: RPR Ser Ql: NONREACTIVE

## 2013-08-23 LAB — CBC
Hemoglobin: 8.9 g/dL — ABNORMAL LOW (ref 12.0–15.0)
MCH: 23.9 pg — ABNORMAL LOW (ref 26.0–34.0)
MCHC: 31.2 g/dL (ref 30.0–36.0)
RDW: 16.8 % — ABNORMAL HIGH (ref 11.5–15.5)
WBC: 8.5 10*3/uL (ref 4.0–10.5)

## 2013-08-23 LAB — PREPARE RBC (CROSSMATCH)

## 2013-08-23 MED ORDER — CITRIC ACID-SODIUM CITRATE 334-500 MG/5ML PO SOLN: 30.0000 mL | ORAL | Status: DC | PRN

## 2013-08-23 MED ORDER — LACTATED RINGERS IV SOLN: 500.0000 mL | Freq: Once | INTRAVENOUS | Status: DC

## 2013-08-23 MED ORDER — OXYTOCIN BOLUS FROM INFUSION: 500.0000 mL | INTRAVENOUS | Status: DC

## 2013-08-23 MED ORDER — LACTATED RINGERS IV SOLN
INTRAVENOUS | Status: DC
  Administered 2013-08-23 (×2): via INTRAVENOUS

## 2013-08-23 MED ORDER — PHENYLEPHRINE 40 MCG/ML (10ML) SYRINGE FOR IV PUSH (FOR BLOOD PRESSURE SUPPORT)
80.0000 ug | PREFILLED_SYRINGE | INTRAVENOUS | Status: DC | PRN
  Filled 2013-08-23: qty 2

## 2013-08-23 MED ORDER — OXYCODONE-ACETAMINOPHEN 5-325 MG PO TABS: 1.0000 | ORAL_TABLET | ORAL | Status: DC | PRN

## 2013-08-23 MED ORDER — IBUPROFEN 600 MG PO TABS
600.0000 mg | ORAL_TABLET | Freq: Four times a day (QID) | ORAL | Status: DC | PRN
  Administered 2013-08-24: 600 mg via ORAL
  Filled 2013-08-23: qty 1

## 2013-08-23 MED ORDER — ONDANSETRON HCL 4 MG/2ML IJ SOLN: 4.0000 mg | Freq: Four times a day (QID) | INTRAMUSCULAR | Status: DC | PRN

## 2013-08-23 MED ORDER — LACTATED RINGERS IV SOLN: 500.0000 mL | INTRAVENOUS | Status: DC | PRN

## 2013-08-23 MED ORDER — OXYTOCIN 40 UNITS IN LACTATED RINGERS INFUSION - SIMPLE MED
62.5000 mL/h | INTRAVENOUS | Status: DC
  Filled 2013-08-23: qty 1000

## 2013-08-23 MED ORDER — TERBUTALINE SULFATE 1 MG/ML IJ SOLN: 0.2500 mg | Freq: Once | INTRAMUSCULAR | Status: AC | PRN

## 2013-08-23 MED ORDER — EPHEDRINE 5 MG/ML INJ
10.0000 mg | INTRAVENOUS | Status: DC | PRN
  Filled 2013-08-23: qty 2

## 2013-08-23 MED ORDER — FENTANYL 2.5 MCG/ML BUPIVACAINE 1/10 % EPIDURAL INFUSION (WH - ANES): 14.0000 mL/h | INTRAMUSCULAR | Status: DC | PRN

## 2013-08-23 MED ORDER — LIDOCAINE HCL (PF) 1 % IJ SOLN
30.0000 mL | INTRAMUSCULAR | Status: DC | PRN
  Filled 2013-08-23 (×2): qty 30

## 2013-08-23 MED ORDER — FLEET ENEMA 7-19 GM/118ML RE ENEM: 1.0000 | ENEMA | RECTAL | Status: DC | PRN

## 2013-08-23 MED ORDER — DIPHENHYDRAMINE HCL 50 MG/ML IJ SOLN: 12.5000 mg | INTRAMUSCULAR | Status: DC | PRN

## 2013-08-23 MED ORDER — ACETAMINOPHEN 325 MG PO TABS: 650.0000 mg | ORAL_TABLET | ORAL | Status: DC | PRN

## 2013-08-23 NOTE — H&P (Signed)
Subjective:  Sydney Mccall is a 27 y.o. G4 P2 female with EDC 08/18/2013 at 40 and 5/[redacted] weeks gestation who is being admitted for labor management.  Her current obstetrical history is significant for late prenatal care.  Patient reports spontaneous ROM this morning around 7:30am with a  Gush of clear fluid soaking her underwear and pants with continued small leak of fluid since that time. She is not having regular contractions. Fetal Movement: normal.    She has received prenatal care by Dr. Tyrell Antonio. She had her membranes stripped on 11/13. She had a post-partum hemorrhage requiring transfusion following her most recent delivery.   Objective:   Vital signs in last 24 hours: Temp:  [98.1 F (36.7 C)] 98.1 F (36.7 C) (11/18 0911) Pulse Rate:  [94] 94 (11/18 0911) Resp:  [18] 18 (11/18 0911) BP: (107)/(93) 107/93 mmHg (11/18 0911) Weight:  [213 lb 12.8 oz (96.979 kg)] 213 lb 12.8 oz (96.979 kg) (11/18 0911)   General:   alert, cooperative and no distress  Skin:   normal  HEENT:  PERRLA, extra ocular movement intact and sclera clear, anicteric  Lungs:   clear to auscultation bilaterally  Heart:   regular rate and rhythm, S1, S2 normal, no murmur, click, rub or gallop and systolic flow murmur at RUSB  Abdomen:  soft, non-tender; bowel sounds normal; no masses,  no organomegaly  Pelvis:  Vulva and vagina appear normal. Bimanual exam reveals normal uterus and adnexa.  FHT:  130 BPM  Uterine Size: size equals dates  Presentations: unsure  Cervix:    Dilation: 5cm   Effacement: 75%   Station:  -2   Consistency: medium   Position: posterior   Lab Review  B, Rh+, Rubella-immune, Hepatitis B surface antigen non-reactive, GBS negative  AFP: Negative  One hour GTT: Normal    Assessment/Plan:  40 and 5/[redacted] weeks gestation. SROM:  Obstetrical history significant for HSV infection without active lesion.     Risks, benefits, alternatives and possible complications have been discussed in  detail with the patient.  Pre-admission, admission, and post admission procedures and expectations were discussed in detail.  All questions answered, all appropriate consents will be signed at the Hospital. Admission is planned for today.  Augmentation: IV Pitocin augmentation.   De Burrs  I examined pt and agree with documentation above and resident plan of care. [facility]

## 2013-08-23 NOTE — MAU Note (Signed)
Patient presenting with possible rupture of membranes.  States feeling her water break around 7:20 this morning and has been leaking ever since.

## 2013-08-23 NOTE — Progress Notes (Addendum)
Sydney Mccall is a 27 y.o. [id number] at [redacted]w[redacted]d by ultrasound admitted for PROM  Subjective: Pt ambulating in hallway with mother. Declines cervical check at this time.   Objective: BP 124/54  Pulse 95  Temp(Src) 98 F (36.7 C) (Oral)  Resp 20  Ht 5\' 3"  (1.6 m)  Wt 96.979 kg (213 lb 12.8 oz)  BMI 37.88 kg/m2  LMP 08/07/2011      FHT:  FHR: 130-140 bpm, variability: moderate,  accelerations:  Present,  decelerations:  Absent UC:   irregular, every 10 minutes SVE:   Dilation: 5 Effacement (%): 70 Exam by:: Rejeana Brock, RN  Labs: Lab Results  Component Value Date   WBC 8.5 08/23/2013   HGB 8.9* 08/23/2013   HCT 28.5* 08/23/2013   MCV 76.6* 08/23/2013   PLT 110* 08/23/2013    Assessment / Plan: Spontaneous labor, progressing normally  Labor: Progressing normally Fetal Wellbeing:  Category I Pain Control:  Labor support without medications Anticipated MOD:  NSVD  Sydney Mccall 08/23/2013, 3:08 PM

## 2013-08-23 NOTE — Progress Notes (Addendum)
Sydney Mccall is a 27 y.o. [phone] at [redacted]w[redacted]d by ultrasound admitted for PROM  Subjective:   Objective: BP 134/68  Pulse 97  Temp(Src) 98.2 F (36.8 C) (Oral)  Resp 20  Ht 5\' 3"  (1.6 m)  Wt 96.979 kg (213 lb 12.8 oz)  BMI 37.88 kg/m2  LMP 08/07/2011      FHT:  FHR: 130s bpm, variability: moderate,  accelerations:  Present,  decelerations:  Absent UC:   irregular SVE:   Dilation: 5.5 Effacement (%): 80 Station: -2 Exam by:: Jerolyn Center, CNM  Labs: Lab Results  Component Value Date   WBC 8.5 08/23/2013   HGB 8.9* 08/23/2013   HCT 28.5* 08/23/2013   MCV 76.6* 08/23/2013   PLT 110* 08/23/2013    Assessment / Plan: Spontaneous labor, progressing normally  Labor: Progressing slowly, encouraging ambulation and nip stim.  Preeclampsia:  n/a Fetal Wellbeing:  Category I Pain Control:  Labor support without medications I/D:  GBS neg Anticipated MOD:  NSVD  Hazeline Junker 08/23/2013, 7:34 PM

## 2013-08-24 ENCOUNTER — Encounter (HOSPITAL_COMMUNITY): Payer: Self-pay | Admitting: *Deleted

## 2013-08-24 ENCOUNTER — Encounter (HOSPITAL_COMMUNITY)
Admission: AD | Disposition: A | Discharge status: Home / Self Care | Payer: Self-pay | Source: Ambulatory Visit | Attending: Obstetrics & Gynecology

## 2013-08-24 ENCOUNTER — Inpatient Hospital Stay (HOSPITAL_COMMUNITY): Payer: Medicaid Other | Admitting: Anesthesiology

## 2013-08-24 ENCOUNTER — Encounter (HOSPITAL_COMMUNITY): Payer: Medicaid Other | Admitting: Anesthesiology

## 2013-08-24 DIAGNOSIS — O4290 Premature rupture of membranes, unspecified as to length of time between rupture and onset of labor, unspecified weeks of gestation: Secondary | ICD-10-CM

## 2013-08-24 DIAGNOSIS — O0930 Supervision of pregnancy with insufficient antenatal care, unspecified trimester: Secondary | ICD-10-CM

## 2013-08-24 DIAGNOSIS — Z302 Encounter for sterilization: Secondary | ICD-10-CM

## 2013-08-24 HISTORY — PX: TUBAL LIGATION: SHX77

## 2013-08-24 LAB — CBC
MCH: 24.1 pg — ABNORMAL LOW (ref 26.0–34.0)
MCV: 76.2 fL — ABNORMAL LOW (ref 78.0–100.0)
Platelets: 107 10*3/uL — ABNORMAL LOW (ref 150–400)
RDW: 17 % — ABNORMAL HIGH (ref 11.5–15.5)
WBC: 10.4 10*3/uL (ref 4.0–10.5)

## 2013-08-24 LAB — SURGICAL PCR SCREEN: MRSA, PCR: NEGATIVE

## 2013-08-24 SURGERY — LIGATION, FALLOPIAN TUBE, POSTPARTUM
Anesthesia: Spinal | Site: Abdomen | Laterality: Bilateral | Wound class: Clean

## 2013-08-24 MED ORDER — DIBUCAINE 1 % RE OINT: 1.0000 "application " | TOPICAL_OINTMENT | RECTAL | Status: DC | PRN

## 2013-08-24 MED ORDER — TETANUS-DIPHTH-ACELL PERTUSSIS 5-2.5-18.5 LF-MCG/0.5 IM SUSP: 0.5000 mL | Freq: Once | INTRAMUSCULAR | Status: DC

## 2013-08-24 MED ORDER — LANOLIN HYDROUS EX OINT: TOPICAL_OINTMENT | CUTANEOUS | Status: DC | PRN

## 2013-08-24 MED ORDER — PRENATAL MULTIVITAMIN CH
1.0000 | ORAL_TABLET | Freq: Every day | ORAL | Status: DC
  Administered 2013-08-25: 1 via ORAL
  Filled 2013-08-24: qty 1

## 2013-08-24 MED ORDER — SIMETHICONE 80 MG PO CHEW: 80.0000 mg | CHEWABLE_TABLET | ORAL | Status: DC | PRN

## 2013-08-24 MED ORDER — METOCLOPRAMIDE HCL 5 MG/ML IJ SOLN: 10.0000 mg | Freq: Once | INTRAMUSCULAR | Status: AC | PRN

## 2013-08-24 MED ORDER — ONDANSETRON HCL 4 MG/2ML IJ SOLN
INTRAMUSCULAR | Status: AC
  Filled 2013-08-24: qty 2

## 2013-08-24 MED ORDER — FAMOTIDINE 20 MG PO TABS
40.0000 mg | ORAL_TABLET | Freq: Once | ORAL | Status: AC
  Administered 2013-08-24: 40 mg via ORAL
  Filled 2013-08-24: qty 2

## 2013-08-24 MED ORDER — LACTATED RINGERS IV SOLN
INTRAVENOUS | Status: DC | PRN
  Administered 2013-08-24 (×2): via INTRAVENOUS

## 2013-08-24 MED ORDER — OXYCODONE-ACETAMINOPHEN 5-325 MG PO TABS
1.0000 | ORAL_TABLET | ORAL | Status: DC | PRN
Start: 2013-08-24 — End: 2013-08-25
  Administered 2013-08-24 – 2013-08-25 (×2): 1 via ORAL
  Filled 2013-08-24 (×3): qty 1

## 2013-08-24 MED ORDER — MIDAZOLAM HCL 2 MG/2ML IJ SOLN
INTRAMUSCULAR | Status: DC | PRN
  Administered 2013-08-24 (×2): 1 mg via INTRAVENOUS

## 2013-08-24 MED ORDER — ONDANSETRON HCL 4 MG/2ML IJ SOLN
INTRAMUSCULAR | Status: DC | PRN
  Administered 2013-08-24: 4 mg via INTRAVENOUS

## 2013-08-24 MED ORDER — PROPOFOL 10 MG/ML IV EMUL
INTRAVENOUS | Status: AC
  Filled 2013-08-24: qty 20

## 2013-08-24 MED ORDER — BUPIVACAINE IN DEXTROSE 0.75-8.25 % IT SOLN
INTRATHECAL | Status: DC | PRN
  Administered 2013-08-24: 1.2 mL via INTRATHECAL

## 2013-08-24 MED ORDER — MIDAZOLAM HCL 2 MG/2ML IJ SOLN
INTRAMUSCULAR | Status: AC
  Filled 2013-08-24: qty 2

## 2013-08-24 MED ORDER — KETOROLAC TROMETHAMINE 30 MG/ML IJ SOLN
15.0000 mg | Freq: Once | INTRAMUSCULAR | Status: AC | PRN
  Administered 2013-08-24: 30 mg via INTRAVENOUS

## 2013-08-24 MED ORDER — FENTANYL CITRATE 0.05 MG/ML IJ SOLN
100.0000 ug | INTRAMUSCULAR | Status: DC | PRN
  Administered 2013-08-24: 100 ug via INTRAVENOUS
  Filled 2013-08-24: qty 2

## 2013-08-24 MED ORDER — FENTANYL CITRATE 0.05 MG/ML IJ SOLN
INTRAMUSCULAR | Status: AC
  Filled 2013-08-24: qty 2

## 2013-08-24 MED ORDER — ZOLPIDEM TARTRATE 5 MG PO TABS: 5.0000 mg | ORAL_TABLET | Freq: Every evening | ORAL | Status: DC | PRN

## 2013-08-24 MED ORDER — DIPHENHYDRAMINE HCL 25 MG PO CAPS: 25.0000 mg | ORAL_CAPSULE | Freq: Four times a day (QID) | ORAL | Status: DC | PRN

## 2013-08-24 MED ORDER — METOCLOPRAMIDE HCL 10 MG PO TABS
10.0000 mg | ORAL_TABLET | Freq: Once | ORAL | Status: AC
  Administered 2013-08-24: 10 mg via ORAL
  Filled 2013-08-24: qty 1

## 2013-08-24 MED ORDER — MEPERIDINE HCL 25 MG/ML IJ SOLN: 6.2500 mg | INTRAMUSCULAR | Status: DC | PRN

## 2013-08-24 MED ORDER — BUPIVACAINE HCL (PF) 0.25 % IJ SOLN
INTRAMUSCULAR | Status: DC | PRN
  Administered 2013-08-24: 20 mL

## 2013-08-24 MED ORDER — LACTATED RINGERS IV SOLN
INTRAVENOUS | Status: DC
  Administered 2013-08-24 (×2): via INTRAVENOUS

## 2013-08-24 MED ORDER — ONDANSETRON HCL 4 MG/2ML IJ SOLN: 4.0000 mg | INTRAMUSCULAR | Status: DC | PRN

## 2013-08-24 MED ORDER — SENNOSIDES-DOCUSATE SODIUM 8.6-50 MG PO TABS
2.0000 | ORAL_TABLET | ORAL | Status: DC
  Administered 2013-08-24: 2 via ORAL
  Filled 2013-08-24: qty 2

## 2013-08-24 MED ORDER — FENTANYL CITRATE 0.05 MG/ML IJ SOLN
INTRAMUSCULAR | Status: DC | PRN
  Administered 2013-08-24 (×3): 50 ug via INTRAVENOUS

## 2013-08-24 MED ORDER — IBUPROFEN 600 MG PO TABS
600.0000 mg | ORAL_TABLET | Freq: Four times a day (QID) | ORAL | Status: DC
  Administered 2013-08-24 – 2013-08-25 (×4): 600 mg via ORAL
  Filled 2013-08-24 (×4): qty 1

## 2013-08-24 MED ORDER — PHENYLEPHRINE 40 MCG/ML (10ML) SYRINGE FOR IV PUSH (FOR BLOOD PRESSURE SUPPORT)
PREFILLED_SYRINGE | INTRAVENOUS | Status: AC
  Filled 2013-08-24: qty 5

## 2013-08-24 MED ORDER — TERBUTALINE SULFATE 1 MG/ML IJ SOLN: 0.2500 mg | Freq: Once | INTRAMUSCULAR | Status: DC | PRN

## 2013-08-24 MED ORDER — BENZOCAINE-MENTHOL 20-0.5 % EX AERO: 1.0000 "application " | INHALATION_SPRAY | CUTANEOUS | Status: DC | PRN

## 2013-08-24 MED ORDER — FENTANYL CITRATE 0.05 MG/ML IJ SOLN
25.0000 ug | INTRAMUSCULAR | Status: DC | PRN
  Administered 2013-08-24 (×4): 50 ug via INTRAVENOUS

## 2013-08-24 MED ORDER — PROPOFOL 10 MG/ML IV BOLUS
INTRAVENOUS | Status: DC | PRN
  Administered 2013-08-24 (×4): 10 mg via INTRAVENOUS

## 2013-08-24 MED ORDER — OXYTOCIN 40 UNITS IN LACTATED RINGERS INFUSION - SIMPLE MED
1.0000 m[IU]/min | INTRAVENOUS | Status: DC
  Administered 2013-08-24: 2 m[IU]/min via INTRAVENOUS

## 2013-08-24 MED ORDER — WITCH HAZEL-GLYCERIN EX PADS: 1.0000 "application " | MEDICATED_PAD | CUTANEOUS | Status: DC | PRN

## 2013-08-24 MED ORDER — ONDANSETRON HCL 4 MG PO TABS: 4.0000 mg | ORAL_TABLET | ORAL | Status: DC | PRN

## 2013-08-24 SURGICAL SUPPLY — 19 items
CHLORAPREP W/TINT 26ML (MISCELLANEOUS) ×2 IMPLANT
CONTAINER PREFILL 10% NBF 15ML (MISCELLANEOUS) ×4 IMPLANT
GLOVE BIOGEL PI IND STRL 6.5 (GLOVE) ×1 IMPLANT
GLOVE BIOGEL PI INDICATOR 6.5 (GLOVE) ×3
GLOVE SURG SS PI 6.0 STRL IVOR (GLOVE) ×2 IMPLANT
GOWN PREVENTION PLUS LG XLONG (DISPOSABLE) ×4 IMPLANT
NDL HYPO 25X1 1.5 SAFETY (NEEDLE) IMPLANT
NEEDLE HYPO 25X1 1.5 SAFETY (NEEDLE) ×2 IMPLANT
NS IRRIG 1000ML POUR BTL (IV SOLUTION) ×2 IMPLANT
PACK ABDOMINAL MINOR (CUSTOM PROCEDURE TRAY) ×2 IMPLANT
SPONGE LAP 4X18 X RAY DECT (DISPOSABLE) ×1 IMPLANT
SUT PLAIN 0 NONE (SUTURE) ×2 IMPLANT
SUT VIC AB 0 CT1 27 (SUTURE) ×2
SUT VIC AB 0 CT1 27XBRD ANBCTR (SUTURE) ×1 IMPLANT
SUT VIC AB 3-0 PS2 18 (SUTURE) ×2 IMPLANT
SYR CONTROL 10ML LL (SYRINGE) ×1 IMPLANT
TOWEL OR 17X24 6PK STRL BLUE (TOWEL DISPOSABLE) ×4 IMPLANT
TRAY FOLEY CATH 14FR (SET/KITS/TRAYS/PACK) ×2 IMPLANT
WATER STERILE IRR 1000ML POUR (IV SOLUTION) ×2 IMPLANT

## 2013-08-24 NOTE — Transfer of Care (Signed)
Immediate Anesthesia Transfer of Care Note  Patient: Sydney Mccall  Procedure(s) Performed: Procedure(s): POST PARTUM TUBAL LIGATION (Bilateral)  Patient Location: PACU  Anesthesia Type:Spinal  Level of Consciousness: awake, alert  and oriented  Airway & Oxygen Therapy: Patient Spontanous Breathing  Post-op Assessment: Report given to PACU RN and Post -op Vital signs reviewed and stable  Post vital signs: Reviewed and stable  Complications: No apparent anesthesia complications

## 2013-08-24 NOTE — Progress Notes (Signed)
27 y.o. yo [phone]  with undesired fertility,status post vaginal delivery, desires permanent sterilization. Risks and benefits of procedure discussed with patient including permanence of method, bleeding, infection, injury to surrounding organs and need for additional procedures. Risk failure of 0.5-1% with increased risk of ectopic gestation if pregnancy occurs was also discussed with patient. Patient without epidural and understands that she will need spinal anesthesia for the surgery. Patient does not like the idea of having this type of anesthesia but will discuss with anesthesiologist. Patient reminded to remain NPO until 1400 when surgery is scheduled.

## 2013-08-24 NOTE — Progress Notes (Signed)
I examined pt and agree with documentation above and resident plan of care. MUHAMMAD,Manika Hast  

## 2013-08-24 NOTE — Progress Notes (Signed)
I examined pt and agree with documentation above and resident plan of care. MUHAMMAD,Sydney Mccall  

## 2013-08-24 NOTE — Progress Notes (Addendum)
Sydney Mccall is a 27 y.o. [phone] at [redacted]w[redacted]d admitted for rupture of membranes  Subjective: Pt feeling some contractions, others she reports are mild. Family at bedside.  Pt reports exhaustion, her other labors did not take this long.   Objective: BP 123/56  Pulse 87  Temp(Src) 97.9 F (36.6 C) (Oral)  Resp 20  Ht 5\' 3"  (1.6 m)  Wt 96.979 kg (213 lb 12.8 oz)  BMI 37.88 kg/m2  LMP 08/07/2011      FHT:  FHR: 135 bpm, variability: moderate,  accelerations:  Present,  decelerations:  Absent UC:   irregular, every 3-6 minutes SVE:   Deferred  Labs: Lab Results  Component Value Date   WBC 8.5 08/23/2013   HGB 8.9* 08/23/2013   HCT 28.5* 08/23/2013   MCV 76.6* 08/23/2013   PLT 110* 08/23/2013    Assessment / Plan: PROM without onset of active labor  Labor: Progressing normally and discussed augmentation with pt.  Pt desires to wait at this time.  Nipple stimulation did increase strength of contractions.  Pt would like to rest and reevaluate in a couple of hours.  Discussed risks of infection with cervical exams/rupture of membranes >18 hours.  Pt states understanding.  May desire Pitocin at some point tonight. Preeclampsia:  n/a Fetal Wellbeing:  Category I Pain Control:  Labor support without medications I/D:  n/a Anticipated MOD:  NSVD  LEFTWICH-KIRBY, LISA 08/24/2013, 12:15 AM

## 2013-08-24 NOTE — Anesthesia Postprocedure Evaluation (Signed)
  Anesthesia Post-op Note  Patient: Sydney Mccall  Procedure(s) Performed: Procedure(s): POST PARTUM TUBAL LIGATION (Bilateral)  Patient is awake, responsive, moving her legs, and has signs of resolution of her numbness. Pain and nausea are reasonably well controlled. Vital signs are stable and clinically acceptable. Oxygen saturation is clinically acceptable. There are no apparent anesthetic complications at this time. Patient is ready for discharge.

## 2013-08-24 NOTE — Progress Notes (Signed)
UR chart review completed.  

## 2013-08-24 NOTE — Progress Notes (Signed)
Sydney Mccall is a 27 y.o. [id number] at [redacted]w[redacted]d admitted for rupture of membranes  Subjective: Pt comfortable, reports feeling mild intermittent cramping.  CNM called to room to discuss starting Pitocin. Pt asked family to step outside during exam and for discussion.  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    Objective: BP 123/56  Pulse 87  Temp(Src) 97.9 F (36.6 C) (Oral)  Resp 20  Ht 5\' 3"  (1.6 m)  Wt 96.979 kg (213 lb 12.8 oz)  BMI 37.88 kg/m2  LMP 08/07/2011      FHT:  FHR: 130 bpm, variability: moderate,  accelerations:  Present,  decelerations:  Absent UC:   irregular, every 4-6 minutes SVE:   Dilation: 5.5 Effacement (%): 80 Station: -2 Exam by:: l. leftwich kirby Soft, anterior, vertex  Labs: Lab Results  Component Value Date   WBC 8.5 08/23/2013   HGB 8.9* 08/23/2013   HCT 28.5* 08/23/2013   MCV 76.6* 08/23/2013   PLT 110* 08/23/2013    Assessment / Plan: PROM without onset of active labor  Labor: Plan to start Pitocin at this time. Preeclampsia:  n/a Fetal Wellbeing:  Category I Pain Control:  Labor support without medications I/D:  n/a Anticipated MOD:  NSVD  LEFTWICH-KIRBY, Sydney Mccall 08/24/2013, 1:08 AM

## 2013-08-24 NOTE — Op Note (Signed)
NAME@ 08/23/2013 - 08/24/2013  PREOPERATIVE DIAGNOSIS:  Undesired fertility  POSTOPERATIVE DIAGNOSIS:  Undesired fertility  PROCEDURE:  Postpartum Bilateral Tubal Sterilization using Pomeroy method   ANESTHESIA:  Epidural  COMPLICATIONS:  None immediate.  ESTIMATED BLOOD LOSS:  Less than 20cc.  FLUIDS: 1600 cc LR.  URINE OUTPUT:  150 cc of clear urine.  INDICATIONS: 27 y.o. yo [phone]  with undesired fertility,status post vaginal delivery, desires permanent sterilization. Risks and benefits of procedure discussed with patient including permanence of method, bleeding, infection, injury to surrounding organs and need for additional procedures. Risk failure of 0.5-1% with increased risk of ectopic gestation if pregnancy occurs was also discussed with patient.   FINDINGS:  Normal uterus, tubes, and ovaries.  TECHNIQUE: After informed consent was obtained, the patient was taken to the operating room where anesthesia was induced and found to be adequate. Quarter percent Marcaine solution was then injected at the incision site. A small transverse, infraumbilical skin incision was made with the scalpel. This incision was carried down to the underlying layer of fascia. The fascia was grasped with Kocher clamps tented up and entered sharply with Mayo scissors. Underlying peritoneum was then identified tented up and entered sharply with Metzenbaum scissors. The fascia was tagged with 0 Vicryl. The patient's right fallopian tube was then identified, brought to the incision, and grasped with a Babcock clamp. The tube was then followed out to the fimbria. The Babcock clamp was then used to grasp the tube approximately 4 cm from the cornual region. A 3 cm segment of the tube was then ligated with free tie of plain gut suture, transected and excised. Good hemostasis was noted and the tube was returned to the abdomen. The left fallopian tube was then identified to its fimbriated end, ligated, and a 3 cm  segment excised in a similar fashion. Excellent hemostasis was noted, and the tube returned to the abdomen. The fascia was re-approximated with 0 Vicryl. The skin was closed in a subcuticular fashion with 3-0 Vicryl. The patient tolerated the procedure well. Sponge, lap, and needle count were correct x2. The patient was taken to recovery room in stable condition.

## 2013-08-24 NOTE — Anesthesia Preprocedure Evaluation (Addendum)
Anesthesia Evaluation  Patient identified by MRN, date of birth, ID band Patient awake    Reviewed: Allergy & Precautions, H&P , NPO status , Patient's Chart, lab work & pertinent test results, reviewed documented beta blocker date and time   History of Anesthesia Complications Negative for: history of anesthetic complications  Airway Mallampati: III TM Distance: >3 FB Neck ROM: full    Dental  (+) Teeth Intact   Pulmonary neg pulmonary ROS,  breath sounds clear to auscultation        Cardiovascular negative cardio ROS  Rhythm:regular Rate:Normal     Neuro/Psych negative neurological ROS  negative psych ROS   GI/Hepatic Neg liver ROS, GERD- (with pregnancy)  Medicated,  Endo/Other  BMI 38  Renal/GU negative Renal ROS  negative genitourinary   Musculoskeletal   Abdominal   Peds  Hematology  (+) anemia ,   Anesthesia Other Findings NPO since 2200  Reproductive/Obstetrics (+) Pregnancy (s/p SVD at 0503) and Breast feeding                           Anesthesia Physical Anesthesia Plan  ASA: II  Anesthesia Plan: Spinal   Post-op Pain Management:    Induction:   Airway Management Planned:   Additional Equipment:   Intra-op Plan:   Post-operative Plan:   Informed Consent: I have reviewed the patients History and Physical, chart, labs and discussed the procedure including the risks, benefits and alternatives for the proposed anesthesia with the patient or authorized representative who has indicated his/her understanding and acceptance.     Plan Discussed with: Surgeon and CRNA  Anesthesia Plan Comments:         Anesthesia Quick Evaluation

## 2013-08-24 NOTE — Anesthesia Procedure Notes (Signed)
Spinal  Patient location during procedure: OR Start time: 08/24/2013 2:44 PM Staffing Performed by: anesthesiologist  Preanesthetic Checklist Completed: patient identified, site marked, surgical consent, pre-op evaluation, timeout performed, IV checked, risks and benefits discussed and monitors and equipment checked Spinal Block Patient position: sitting Prep: ChloraPrep and site prepped and draped Patient monitoring: heart rate, cardiac monitor, continuous pulse ox and blood pressure Approach: midline Location: L3-4 Injection technique: single-shot Needle Needle type: Pencan  Needle gauge: 24 G Needle length: 9 cm Assessment Sensory level: T4 Additional Notes Clear free flow CSF on first attempt.  No paresthesia.  Patient tolerated procedure well with no apparent complications.  Jasmine December, MD

## 2013-08-25 ENCOUNTER — Encounter (HOSPITAL_COMMUNITY): Payer: Self-pay | Admitting: Obstetrics and Gynecology

## 2013-08-25 ENCOUNTER — Encounter: Payer: Medicaid Other | Admitting: Advanced Practice Midwife

## 2013-08-25 LAB — CBC
MCH: 24.3 pg — ABNORMAL LOW (ref 26.0–34.0)
MCHC: 31.8 g/dL (ref 30.0–36.0)
MCV: 76.3 fL — ABNORMAL LOW (ref 78.0–100.0)
Platelets: 120 10*3/uL — ABNORMAL LOW (ref 150–400)
RDW: 17.1 % — ABNORMAL HIGH (ref 11.5–15.5)

## 2013-08-25 MED ORDER — OXYCODONE-ACETAMINOPHEN 5-325 MG PO TABS: 1.0000 | ORAL_TABLET | ORAL | Status: DC | PRN

## 2013-08-25 MED ORDER — FERROUS SULFATE 325 (65 FE) MG PO TABS: 325.0000 mg | ORAL_TABLET | Freq: Two times a day (BID) | ORAL | Status: DC

## 2013-08-25 MED ORDER — IBUPROFEN 600 MG PO TABS: 600.0000 mg | ORAL_TABLET | Freq: Four times a day (QID) | ORAL | Status: DC

## 2013-08-25 NOTE — Discharge Summary (Signed)
Obstetric Discharge Summary Reason for Admission: onset of labor and rupture of membranes Prenatal Procedures: none Intrapartum Procedures: spontaneous vaginal delivery and tubal ligation Postpartum Procedures: none Complications-Operative and Postpartum: none Hemoglobin  Date Value Range Status  08/25/2013 7.7* 12.0 - 15.0 g/dL Final     HCT  Date Value Range Status  08/25/2013 24.2* 36.0 - 46.0 % Final   Sydney Mccall is a 27yo [id number] at 40.6wks who was admitted with SROM @ 0730 on 11/18. She required Pitocin augmentation eventually and then progressed quickly to SVD from that point in the early AM of 11/19. Later that afternoon she underwent a BTL procedure. By PP/PO Day 1 she is doing well and is requesting to be discharged home. She is breast and bottlefeeding and has a follow up appointment already scheduled at [facility] for in 1-2 weeks.  Physical Exam:  General: alert, cooperative and no distress Heart: RRR Lungs: nl effort Lochia: appropriate Uterine Fundus: firm Incision: 2x2 stained and unchanged DVT Evaluation: No evidence of DVT seen on physical exam.  Discharge Diagnoses: Term Pregnancy-delivered  Discharge Information: Date: 08/25/2013 Activity: pelvic rest Diet: routine Medications: PNV, Ibuprofen, Iron and Percocet Condition: stable Instructions: refer to practice specific booklet Discharge to: home Follow-up Information   Follow up with [facility]. (Keep scheduled appointment.)    Contact information:   [address] [place] [address] [phone]      Newborn Data: Live born female  Birth Weight: 8 lb 3.9 oz (3740 g) APGAR: 9, 9  Home with mother.  Cam Hai 08/25/2013, 7:45 AM

## 2013-08-25 NOTE — Lactation Note (Signed)
This note was copied from the chart of Sydney Mccall. Lactation Consultation Note  Patient Name: Sydney Latrena Benegas [id number] Date: 08/25/2013 Reason for consult: Follow-up assessment Mom reports baby is nursing well, denies questions or concerns. She is breast and bottle feeding by choice. Encouraged to BF with each feeding to encourage milk production, prevent engorgement and protect milk supply. Engorgement care reviewed if needed. Advised Mom to call if she would like LC to observe latch before d/c. Advised of OP services and support group.   Maternal Data    Feeding Feeding Type: Breast Fed Length of feed: 15 min  LATCH Score/Interventions                      Lactation Tools Discussed/Used     Consult Status Consult Status: Complete Date: 08/25/13 Follow-up type: In-patient    Alfred Levins 08/25/2013, 12:42 PM

## 2013-08-25 NOTE — Anesthesia Postprocedure Evaluation (Signed)
  Anesthesia Post-op Note  Patient: Sydney Mccall  Procedure(s) Performed: Procedure(s): POST PARTUM TUBAL LIGATION (Bilateral)  Patient Location: Mother/Baby  Anesthesia Type:Spinal  Level of Consciousness: awake, alert  and oriented  Airway and Oxygen Therapy: Patient Spontanous Breathing and Patient connected to nasal cannula oxygen  Post-op Pain: none  Post-op Assessment: Post-op Vital signs reviewed, Patient's Cardiovascular Status Stable, No headache, No backache, No residual numbness and No residual motor weakness  Post-op Vital Signs: Reviewed and stable  Complications: No apparent anesthesia complications

## 2013-08-26 LAB — TYPE AND SCREEN
Antibody Screen: NEGATIVE
Unit division: 0

## 2013-08-26 NOTE — Op Note (Signed)
I was present for the entire length of this surgical procedure and agree with above documentation

## 2013-08-27 ENCOUNTER — Inpatient Hospital Stay (HOSPITAL_COMMUNITY): Admission: RE | Admit: 2013-08-27 | Payer: No Typology Code available for payment source | Source: Ambulatory Visit

## 2013-09-12 ENCOUNTER — Telehealth: Payer: Self-pay | Admitting: Family Medicine

## 2013-09-12 ENCOUNTER — Other Ambulatory Visit: Payer: Self-pay | Admitting: *Deleted

## 2013-09-12 MED ORDER — NYSTATIN-TRIAMCINOLONE 100000-0.1 UNIT/GM-% EX OINT: 1.0000 "application " | TOPICAL_OINTMENT | Freq: Two times a day (BID) | CUTANEOUS | Status: DC

## 2013-09-12 NOTE — Telephone Encounter (Signed)
Notified pt. Pt verbalized understanding.

## 2013-09-12 NOTE — Telephone Encounter (Signed)
Nurse to spk with. Both breasts painful? Does she see a rash? Any fever? How long br feeding?

## 2013-09-12 NOTE — Telephone Encounter (Signed)
Do not rec tabs for this. rec otc lanolin cream for women who breast feed.Also  Add rx nystatin cream bid to affected area rx one tube

## 2013-09-12 NOTE — Telephone Encounter (Signed)
Patient called today with complains of painful breasts. She believes that Cadence has given her a yeast infection through breastfeeding and she is hoping to pick up a hard copy Rx for a pill form of something to treat this.     [facility]

## 2013-09-27 ENCOUNTER — Telehealth: Payer: Self-pay | Admitting: Family Medicine

## 2013-09-27 MED ORDER — METRONIDAZOLE 0.75 % VA GEL: 1.0000 | Freq: Every day | VAGINAL | Status: DC

## 2013-09-27 NOTE — Telephone Encounter (Signed)
Ok plus 2 ref 

## 2013-09-27 NOTE — Telephone Encounter (Signed)
Patient needs Rx for vandazole 0.75% vaginal cream. She was prescribed this on 10/04/2012.  [facility]

## 2013-09-27 NOTE — Telephone Encounter (Signed)
Rx sent electronically to [facility]. Patient notified. 

## 2013-09-28 ENCOUNTER — Other Ambulatory Visit: Payer: Self-pay | Admitting: Nurse Practitioner

## 2013-09-28 ENCOUNTER — Telehealth: Payer: Self-pay | Admitting: Family Medicine

## 2013-09-28 MED ORDER — METRONIDAZOLE 0.75 % VA GEL: VAGINAL | Status: DC

## 2013-09-28 NOTE — Telephone Encounter (Signed)
Already done. Spoke with patient this am when she brought in her daughter for visit.

## 2013-09-28 NOTE — Telephone Encounter (Signed)
Needs Rx for Metrogel Vaginal .75% sent to [facility] (must be name brand), other pharmacy don't have it in stock.  Please send to [facility]

## 2013-10-07 ENCOUNTER — Encounter: Payer: Self-pay | Admitting: Adult Health

## 2013-10-07 ENCOUNTER — Ambulatory Visit (INDEPENDENT_AMBULATORY_CARE_PROVIDER_SITE_OTHER): Payer: Medicaid Other | Admitting: Adult Health

## 2013-10-07 NOTE — Progress Notes (Signed)
Patient ID: Sydney Mccall, female   DOB: May 12, 1986, 28 y.o.   MRN: [id number] Sydney Mccall is a 28 year old black female in for postpartum visit.  Delivery Date:08/24/13  Method of Delivery: Vaginal delivery baby girl 8 lbs 3.9 oz and had BTL in hospital  Sexual Activity since delivery: yes  Method of Feeding: Bottle feeding  Number of weeks bleeding post delivery: 3 weeks Reviewed past medical,surgical, social and family history. Reviewed medications and allergies.  Review of Systems: Patient denies any headaches, blurred vision, shortness of breath, chest pain, abdominal pain, problems with bowel movements, urination, or intercourse. No joint pain or mood swings, has scab at navel.  Depression Score: 1 BP 128/76  Ht 5\' 4"  (1.626 m)  Wt 196 lb (88.905 kg)  BMI 33.63 kg/m2  LMP 10/06/2013  Breastfeeding? No Pelvic Exam:   External genitalia is normal in appearance.  The vagina is normal in appearance,scant pink discharge. The cervix is bulbous.  Uterus is felt to be normal size, shape, and contour, well involuted.  No adnexal masses or tenderness noted.   Impression:  Status post delivery, post partum check, depression screening   Plan:   Return in 1 year for pap and physical  Ok to return to work, if needs note will let us know.

## 2013-10-07 NOTE — Patient Instructions (Signed)
Follow up in 1 year for pap and physical  Call prn

## 2013-10-11 ENCOUNTER — Telehealth: Payer: Self-pay | Admitting: Family Medicine

## 2013-10-11 MED ORDER — METRONIDAZOLE 0.75 % VA GEL: 1.0000 | Freq: Every day | VAGINAL | Status: DC

## 2013-10-11 NOTE — Telephone Encounter (Signed)
Once daily apllicator per vag qhs times 7 d

## 2013-10-11 NOTE — Telephone Encounter (Signed)
Patient says pharmacy never had Metrogel Vaginal .75%-She would like this sent to [facility]

## 2013-10-11 NOTE — Telephone Encounter (Signed)
Ok lets do 

## 2013-10-11 NOTE — Telephone Encounter (Signed)
Med sent and patient notified

## 2013-10-11 NOTE — Telephone Encounter (Addendum)
Need directions please

## 2013-10-18 ENCOUNTER — Encounter: Payer: Self-pay | Admitting: *Deleted

## 2013-11-04 ENCOUNTER — Other Ambulatory Visit: Payer: Self-pay | Admitting: Family Medicine

## 2013-12-16 ENCOUNTER — Ambulatory Visit (INDEPENDENT_AMBULATORY_CARE_PROVIDER_SITE_OTHER): Payer: Medicaid Other | Admitting: Family Medicine

## 2013-12-16 VITALS — Temp 98.4°F

## 2013-12-16 DIAGNOSIS — R300 Dysuria: Secondary | ICD-10-CM

## 2013-12-16 LAB — POCT URINALYSIS DIPSTICK
Nitrite, UA: POSITIVE
Spec Grav, UA: 1.02
pH, UA: 5

## 2013-12-16 MED ORDER — CIPROFLOXACIN HCL 500 MG PO TABS: 500.0000 mg | ORAL_TABLET | Freq: Two times a day (BID) | ORAL | Status: AC

## 2013-12-16 MED ORDER — FLUCONAZOLE 150 MG PO TABS: 150.0000 mg | ORAL_TABLET | Freq: Once | ORAL | Status: DC

## 2013-12-16 NOTE — Progress Notes (Signed)
   Subjective:    Patient ID: Sydney Mccall, female    DOB: Oct 11, 1985, 28 y.o.   MRN: [id number]  HPI She describes urinary free to see some dysuria denies any other particular troubles no fever chills   Review of Systems     Objective:   Physical Exam   Urinalysis does show WBCs antibiotics recommend     Assessment & Plan:  UTI-antibiotics prescribed should gradually get better

## 2014-02-22 ENCOUNTER — Telehealth: Payer: Self-pay | Admitting: Adult Health

## 2014-02-22 NOTE — Telephone Encounter (Signed)
Complains of bleeding and pressure to come in tomorrow at 11:45 am

## 2014-02-22 NOTE — Telephone Encounter (Signed)
No voice mail box.

## 2014-02-23 ENCOUNTER — Ambulatory Visit (INDEPENDENT_AMBULATORY_CARE_PROVIDER_SITE_OTHER): Payer: Medicaid Other | Admitting: Adult Health

## 2014-02-23 ENCOUNTER — Encounter: Payer: Self-pay | Admitting: Adult Health

## 2014-02-23 ENCOUNTER — Telehealth: Payer: Self-pay | Admitting: Adult Health

## 2014-02-23 ENCOUNTER — Other Ambulatory Visit: Payer: Self-pay | Admitting: Adult Health

## 2014-02-23 ENCOUNTER — Other Ambulatory Visit (INDEPENDENT_AMBULATORY_CARE_PROVIDER_SITE_OTHER): Payer: Medicaid Other

## 2014-02-23 ENCOUNTER — Encounter: Payer: Self-pay | Admitting: Obstetrics & Gynecology

## 2014-02-23 VITALS — BP 114/58 | Ht 63.0 in | Wt 189.0 lb

## 2014-02-23 DIAGNOSIS — O9989 Other specified diseases and conditions complicating pregnancy, childbirth and the puerperium: Principal | ICD-10-CM

## 2014-02-23 DIAGNOSIS — O99891 Other specified diseases and conditions complicating pregnancy: Secondary | ICD-10-CM

## 2014-02-23 DIAGNOSIS — N949 Unspecified condition associated with female genital organs and menstrual cycle: Secondary | ICD-10-CM

## 2014-02-23 DIAGNOSIS — Z3490 Encounter for supervision of normal pregnancy, unspecified, unspecified trimester: Secondary | ICD-10-CM | POA: Insufficient documentation

## 2014-02-23 DIAGNOSIS — O00109 Unspecified tubal pregnancy without intrauterine pregnancy: Secondary | ICD-10-CM

## 2014-02-23 DIAGNOSIS — Z3200 Encounter for pregnancy test, result unknown: Secondary | ICD-10-CM

## 2014-02-23 LAB — HCG, QUANTITATIVE, PREGNANCY: HCG, BETA CHAIN, QUANT, S: 938.3 m[IU]/mL

## 2014-02-23 NOTE — Telephone Encounter (Signed)
Pt aware QHCG 938.3 to come in now for [place] to r/o ectopic

## 2014-02-23 NOTE — Progress Notes (Signed)
Subjective:     Patient ID: [name], female   DOB: 10-12-1985, 28 y.o.   MRN: [id number]  HPI Anaira is a 29 year old black female in for [place], she had a QHCG of 938.3 this am and she had a postpartum BTL.She is spotting now and has had some pressure.Her blood type is B+.  Review of Systems See HPI Reviewed past medical,surgical, social and family history. Reviewed medications and allergies.     Objective:   Physical Exam BP 114/58  Ht 5\' 3"  (1.6 m)  Wt 189 lb (85.73 kg)  BMI 33.49 kg/m2  LMP 02/14/2014   US showed no IUP, left CL cyst, no free fluid  Assessment:     Pregnant, need to R/O ectopic, sp BTL    Plan:     Return in 5 days for [place] and [facility]  Review handout on ectopic, if has pain go to [facility], pt states understanding

## 2014-02-23 NOTE — Patient Instructions (Signed)
Ectopic Pregnancy An ectopic pregnancy is when the fertilized egg attaches (implants) outside the uterus. Most ectopic pregnancies occur in the fallopian tube. Rarely do ectopic pregnancies occur on the ovary, intestine, pelvis, or cervix. In an ectopic pregnancy, the fertilized egg does not have the ability to develop into a normal, healthy baby.  A ruptured ectopic pregnancy is one in which the fallopian tube gets torn or bursts and results in internal bleeding. Often there is intense abdominal pain, and sometimes, vaginal bleeding. Having an ectopic pregnancy can be life threatening. If left untreated, this dangerous condition can lead to a blood transfusion, abdominal surgery, or even death. CAUSES  Damage to the fallopian tubes is the suspected cause in most ectopic pregnancies.  RISK FACTORS Depending on your circumstances, the risk of having an ectopic pregnancy will vary. The level of risk can be divided into three categories. High Risk  You have gone through infertility treatment.  You have had a previous ectopic pregnancy.  You have had previous tubal surgery.  You have had previous surgery to have the fallopian tubes tied (tubal ligation).  You have tubal problems or diseases.  You have been exposed to DES. DES is a medicine that was used until 1971 and had effects on babies whose mothers took the medicine.  You become pregnant while using an intrauterine device (IUD) for birth control. Moderate Risk  You have a history of infertility.  You have a history of a sexually transmitted infection (STI).  You have a history of pelvic inflammatory disease (PID).  You have scarring from endometriosis.  You have multiple sexual partners.  You smoke. Low Risk  You have had previous pelvic surgery.  You use vaginal douching.  You became sexually active before 29 years of age. SIGNS AND SYMPTOMS  An ectopic pregnancy should be suspected in anyone who has missed a period and  has abdominal pain or bleeding.  You may experience normal pregnancy symptoms, such as:  Nausea.  Tiredness.  Breast tenderness.  Other symptoms may include:  Pain with intercourse.  Irregular vaginal bleeding or spotting.  Cramping or pain on one side or in the lower abdomen.  Fast heartbeat.  Passing out while having a bowel movement.  Symptoms of a ruptured ectopic pregnancy and internal bleeding may include:  Sudden, severe pain in the abdomen and pelvis.  Dizziness or fainting.  Pain in the shoulder area. DIAGNOSIS  Tests that may be performed include:  A pregnancy test.  An ultrasound test.  Testing the specific level of pregnancy hormone in the bloodstream.  Taking a sample of uterus tissue (dilation and curettage, D&C).  Surgery to perform a visual exam of the inside of the abdomen using a thin, lighted tube with a tiny camera on the end (laparoscope). TREATMENT  An injection of a medicine called methotrexate may be given. This medicine causes the pregnancy tissue to be absorbed. It is given if:  The diagnosis is made early.  The fallopian tube has not ruptured.  You are considered to be a good candidate for the medicine. Usually, pregnancy hormone blood levels are checked after methotrexate treatment. This is to be sure the medicine is effective. It may take 4 6 weeks for the pregnancy to be absorbed (though most pregnancies will be absorbed by 3 weeks). Surgical treatment may be needed. A laparoscope may be used to remove the pregnancy tissue. If severe internal bleeding occurs, a cut (incision) may be made in the lower abdomen (laparotomy), and the  ectopic pregnancy is removed. This stops the bleeding. Part of the fallopian tube, or the whole tube, may be removed as well (salpingectomy). After surgery, pregnancy hormone tests may be done to be sure there is no pregnancy tissue left. You may receive an Rho(D) immune globulin shot if you are Rh negative and  the father is Rh positive, or if you do not know the Rh type of the father. This is to prevent problems with any future pregnancy. SEEK IMMEDIATE MEDICAL CARE IF:  You have any symptoms of an ectopic pregnancy. This is a medical emergency. Document Released: 10/30/2004 Document Revised: 07/13/2013 Document Reviewed: 04/21/2013 [facility] Patient Information 2014 [place], [place]. Return in 5 days for [place] and labs and see me If increase pain go to [facility]

## 2014-02-23 NOTE — Telephone Encounter (Signed)
Pt took pregnancy test at home and it is + keep appt

## 2014-02-24 ENCOUNTER — Other Ambulatory Visit: Payer: Self-pay | Admitting: Adult Health

## 2014-02-24 ENCOUNTER — Other Ambulatory Visit: Payer: Self-pay

## 2014-02-24 DIAGNOSIS — O200 Threatened abortion: Secondary | ICD-10-CM

## 2014-02-24 DIAGNOSIS — O3680X0 Pregnancy with inconclusive fetal viability, not applicable or unspecified: Secondary | ICD-10-CM

## 2014-02-28 ENCOUNTER — Other Ambulatory Visit: Payer: Self-pay | Admitting: Adult Health

## 2014-02-28 ENCOUNTER — Ambulatory Visit (INDEPENDENT_AMBULATORY_CARE_PROVIDER_SITE_OTHER): Payer: Medicaid Other

## 2014-02-28 ENCOUNTER — Other Ambulatory Visit: Payer: Medicaid Other | Admitting: Obstetrics & Gynecology

## 2014-02-28 ENCOUNTER — Ambulatory Visit (INDEPENDENT_AMBULATORY_CARE_PROVIDER_SITE_OTHER): Payer: Medicaid Other | Admitting: Obstetrics & Gynecology

## 2014-02-28 ENCOUNTER — Encounter (HOSPITAL_COMMUNITY)
Admission: RE | Admit: 2014-02-28 | Discharge: 2014-02-28 | Disposition: A | Discharge status: Home / Self Care | Payer: Medicaid Other | Source: Ambulatory Visit | Attending: Obstetrics & Gynecology | Admitting: Obstetrics & Gynecology

## 2014-02-28 ENCOUNTER — Encounter: Payer: Self-pay | Admitting: Adult Health

## 2014-02-28 ENCOUNTER — Encounter (HOSPITAL_COMMUNITY): Payer: Self-pay

## 2014-02-28 VITALS — BP 110/68 | Ht 63.0 in | Wt 191.0 lb

## 2014-02-28 DIAGNOSIS — Z3201 Encounter for pregnancy test, result positive: Secondary | ICD-10-CM

## 2014-02-28 DIAGNOSIS — O00109 Unspecified tubal pregnancy without intrauterine pregnancy: Secondary | ICD-10-CM | POA: Diagnosis not present

## 2014-02-28 DIAGNOSIS — K219 Gastro-esophageal reflux disease without esophagitis: Secondary | ICD-10-CM | POA: Diagnosis not present

## 2014-02-28 DIAGNOSIS — D649 Anemia, unspecified: Secondary | ICD-10-CM | POA: Diagnosis not present

## 2014-02-28 DIAGNOSIS — Z302 Encounter for sterilization: Secondary | ICD-10-CM | POA: Diagnosis not present

## 2014-02-28 DIAGNOSIS — O3680X0 Pregnancy with inconclusive fetal viability, not applicable or unspecified: Secondary | ICD-10-CM

## 2014-02-28 DIAGNOSIS — Z01812 Encounter for preprocedural laboratory examination: Secondary | ICD-10-CM | POA: Diagnosis not present

## 2014-02-28 DIAGNOSIS — O009 Unspecified ectopic pregnancy without intrauterine pregnancy: Secondary | ICD-10-CM

## 2014-02-28 DIAGNOSIS — O200 Threatened abortion: Secondary | ICD-10-CM

## 2014-02-28 DIAGNOSIS — K661 Hemoperitoneum: Secondary | ICD-10-CM | POA: Diagnosis not present

## 2014-02-28 HISTORY — DX: Unspecified ectopic pregnancy without intrauterine pregnancy: O00.90

## 2014-02-28 LAB — CBC
HCT: 31.7 % — ABNORMAL LOW (ref 36.0–46.0)
HEMOGLOBIN: 10.4 g/dL — AB (ref 12.0–15.0)
MCH: 28.7 pg (ref 26.0–34.0)
MCHC: 32.8 g/dL (ref 30.0–36.0)
MCV: 87.6 fL (ref 78.0–100.0)
Platelets: 154 10*3/uL (ref 150–400)
RBC: 3.62 MIL/uL — ABNORMAL LOW (ref 3.87–5.11)
RDW: 14.2 % (ref 11.5–15.5)
WBC: 6.1 10*3/uL (ref 4.0–10.5)

## 2014-02-28 LAB — URINALYSIS, ROUTINE W REFLEX MICROSCOPIC
BILIRUBIN URINE: NEGATIVE
Glucose, UA: NEGATIVE mg/dL
KETONES UR: NEGATIVE mg/dL
Leukocytes, UA: NEGATIVE
NITRITE: NEGATIVE
Protein, ur: NEGATIVE mg/dL
Specific Gravity, Urine: 1.015 (ref 1.005–1.030)
Urobilinogen, UA: 0.2 mg/dL (ref 0.0–1.0)
pH: 6 (ref 5.0–8.0)

## 2014-02-28 LAB — URINE MICROSCOPIC-ADD ON

## 2014-02-28 LAB — COMPREHENSIVE METABOLIC PANEL
ALT: 10 U/L (ref 0–35)
AST: 16 U/L (ref 0–37)
Albumin: 3.5 g/dL (ref 3.5–5.2)
Alkaline Phosphatase: 78 U/L (ref 39–117)
BUN: 7 mg/dL (ref 6–23)
CALCIUM: 8.9 mg/dL (ref 8.4–10.5)
CO2: 24 mEq/L (ref 19–32)
Chloride: 102 mEq/L (ref 96–112)
Creatinine, Ser: 0.7 mg/dL (ref 0.50–1.10)
GFR calc non Af Amer: >90 mL/min (ref >90)
GLUCOSE: 102 mg/dL — AB (ref 70–99)
Potassium: 3.9 mEq/L (ref 3.7–5.3)
SODIUM: 138 meq/L (ref 137–147)
TOTAL PROTEIN: 7.3 g/dL (ref 6.0–8.3)
Total Bilirubin: 0.3 mg/dL (ref 0.3–1.2)

## 2014-02-28 LAB — TYPE AND SCREEN
ABO/RH(D): B POS
ANTIBODY SCREEN: NEGATIVE

## 2014-02-28 LAB — HCG, QUANTITATIVE, PREGNANCY: hCG, Beta Chain, Quant, S: 2589 m[IU]/mL — ABNORMAL HIGH (ref <5)

## 2014-02-28 MED ORDER — BUPIVACAINE LIPOSOME 1.3 % IJ SUSP
20.0000 mL | Freq: Once | INTRAMUSCULAR | Status: DC
  Filled 2014-02-28: qty 20

## 2014-02-28 NOTE — Patient Instructions (Signed)
Sydney Mccall  02/28/2014   Your procedure is scheduled on:  03/01/2014  Report to [facility] at  1000  AM.  Call this number if you have problems the morning of surgery: [phone]   Remember:   Do not eat food or drink liquids after midnight.   Take these medicines the morning of surgery with A SIP OF WATER: none   Do not wear jewelry, make-up or nail polish.  Do not wear lotions, powders, or perfumes.   Do not shave 48 hours prior to surgery. Men may shave face and neck.  Do not bring valuables to the hospital.  [facility] is not responsible for any belongings or valuables.               Contacts, dentures or bridgework may not be worn into surgery.  Leave suitcase in the car. After surgery it may be brought to your room.  For patients admitted to the hospital, discharge time is determined by your treatment team.               Patients discharged the day of surgery will not be allowed to drive home.  Name and phone number of your driver: family  Special Instructions: Shower using CHG 2 nights before surgery and the night before surgery.  If you shower the day of surgery use CHG.  Use special wash - you have one bottle of CHG for all showers.  You should use approximately 1/3 of the bottle for each shower.   Please read over the following fact sheets that you were given: Pain Booklet, Coughing and Deep Breathing, Surgical Site Infection Prevention, Anesthesia Post-op Instructions and Care and Recovery After Surgery Sterilization Information, Female Female sterilization is a procedure to permanently prevent pregnancy. There are different ways to perform sterilization, but all either block or close the fallopian tubes so that your eggs cannot reach your uterus. If your egg cannot reach your uterus, sperm cannot fertilize the egg, and you cannot get pregnant.  Sterilization is performed by a surgical procedure. Sometimes these procedures are performed in a hospital while a  patient is asleep. Sometimes they can be done in a clinic setting with the patient awake. The fallopian tubes can be surgically cut, tied, or sealed through a procedure called tubal ligation. The fallopian tubes can also be closed with clips or rings. Sterilization can also be done by placing a tiny coil into each fallopian tube, which causes scar tissue to grow inside the tube. The scar tissue then blocks the tubes.  Discuss sterilization with your caregiver to answer any concerns you or your partner may have. You may want to ask what type of sterilization your caregiver performs. Some caregivers may not perform all the various options. Sterilization is permanent and should only be done if you are sure you do not want children or do not want any more children. Having a sterilization reversed may not be successful.  STERILIZATION PROCEDURES  Laparoscopic sterilization. This is a surgical method performed at a time other than right after childbirth. Two incisions are made in the lower abdomen. A thin, lighted tube (laparoscope) is inserted into one of the incisions and is used to perform the procedure. The fallopian tubes are closed with a ring or a clip. An instrument that uses heat could be used to seal the tubes closed (electrocautery).   Mini-laparotomy. This is a surgical method done 1 or 2 days after giving birth.  Typically, a small incision is made just below the belly button (umbilicus) and the fallopian tubes are exposed. The tubes can then be sealed, tied, or cut.   Hysteroscopic sterilization. This is performed at a time other than right after childbirth. A tiny, spring-like coil is inserted through the cervix and uterus and placed into the fallopian tubes. The coil causes scaring and blocks the tubes. Other forms of contraception should be used for 3 months after the procedure to allow the scar tissue to form completely. Additionally, it is required hysterosalpingography be done 3 months later  to ensure that the procedure was successful. Hysterosalpingography is a procedure that uses X-rays to look at your uterus and fallopian tubes after a material to make them show up better has been inserted. IS STERILIZATION SAFE? Sterilization is considered safe with very rare complications. Risks depend on the type of procedure you have. As with any surgical procedure, there are risks. Some risks of sterilization by any means include:   Bleeding.  Infection.  Reaction to anesthesia medicine.  Injury to surrounding organs. Risks specific to having hysteroscopic coils placed include:  The coils may not be placed correctly the first time.   The coils may move out of place.   The tubes may not get completely blocked after 3 months.   Injury to surrounding organs when placing the coil.  HOW EFFECTIVE IS FEMALE STERILIZATION? Sterilization is nearly 100% effective, but it can fail. Depending on the type of sterilization, the rate of failure can be as high as 3%. After hysteroscopic sterilization with placement of fallopian tube coils, you will need back-up birth control for 3 months after the procedure. Sterilization is effective for a lifetime.  BENEFITS OF STERILIZATION  It does not affect your hormones, and therefore will not affect your menstrual periods, sexual desire, or performance.   It is effective for a lifetime.   It is safe.   You do not need to worry about getting pregnant. Keep in mind that if you had the hysteroscopic placement procedure, you must wait 3 months after the procedure (or until your caregiver confirms) before pregnancy is not considered possible.   There are no side effects unlike other types of birth control (contraception).  DRAWBACKS OF STERILIZATION  You must be sure you do not want children or any more children. The procedure is permanent.   It does not provide protection against sexually transmitted infections (STIs).   The tubes can  grow back together. If this happens, there is a risk of pregnancy. There is also an increased risk (50%) of pregnancy being an ectopic pregnancy. This is a pregnancy that happens outside of the uterus. Document Released: 03/10/2008 Document Revised: 03/23/2012 Document Reviewed: 01/08/2012 [facility] Patient Information 2014 [place], [place]. Ectopic Pregnancy An ectopic pregnancy is when the fertilized egg attaches (implants) outside the uterus. Most ectopic pregnancies occur in the fallopian tube. Rarely do ectopic pregnancies occur on the ovary, intestine, pelvis, or cervix. In an ectopic pregnancy, the fertilized egg does not have the ability to develop into a normal, healthy baby.  A ruptured ectopic pregnancy is one in which the fallopian tube gets torn or bursts and results in internal bleeding. Often there is intense abdominal pain, and sometimes, vaginal bleeding. Having an ectopic pregnancy can be life threatening. If left untreated, this dangerous condition can lead to a blood transfusion, abdominal surgery, or even death. CAUSES  Damage to the fallopian tubes is the suspected cause in most ectopic pregnancies.  RISK  FACTORS Depending on your circumstances, the risk of having an ectopic pregnancy will vary. The level of risk can be divided into three categories. High Risk  You have gone through infertility treatment.  You have had a previous ectopic pregnancy.  You have had previous tubal surgery.  You have had previous surgery to have the fallopian tubes tied (tubal ligation).  You have tubal problems or diseases.  You have been exposed to DES. DES is a medicine that was used until 1971 and had effects on babies whose mothers took the medicine.  You become pregnant while using an intrauterine device (IUD) for birth control. Moderate Risk  You have a history of infertility.  You have a history of a sexually transmitted infection (STI).  You have a history of pelvic  inflammatory disease (PID).  You have scarring from endometriosis.  You have multiple sexual partners.  You smoke. Low Risk  You have had previous pelvic surgery.  You use vaginal douching.  You became sexually active before 29 years of age. SIGNS AND SYMPTOMS  An ectopic pregnancy should be suspected in anyone who has missed a period and has abdominal pain or bleeding.  You may experience normal pregnancy symptoms, such as:  Nausea.  Tiredness.  Breast tenderness.  Other symptoms may include:  Pain with intercourse.  Irregular vaginal bleeding or spotting.  Cramping or pain on one side or in the lower abdomen.  Fast heartbeat.  Passing out while having a bowel movement.  Symptoms of a ruptured ectopic pregnancy and internal bleeding may include:  Sudden, severe pain in the abdomen and pelvis.  Dizziness or fainting.  Pain in the shoulder area. DIAGNOSIS  Tests that may be performed include:  A pregnancy test.  An ultrasound test.  Testing the specific level of pregnancy hormone in the bloodstream.  Taking a sample of uterus tissue (dilation and curettage, D&C).  Surgery to perform a visual exam of the inside of the abdomen using a thin, lighted tube with a tiny camera on the end (laparoscope). TREATMENT  An injection of a medicine called methotrexate may be given. This medicine causes the pregnancy tissue to be absorbed. It is given if:  The diagnosis is made early.  The fallopian tube has not ruptured.  You are considered to be a good candidate for the medicine. Usually, pregnancy hormone blood levels are checked after methotrexate treatment. This is to be sure the medicine is effective. It may take 4 6 weeks for the pregnancy to be absorbed (though most pregnancies will be absorbed by 3 weeks). Surgical treatment may be needed. A laparoscope may be used to remove the pregnancy tissue. If severe internal bleeding occurs, a cut (incision) may be made  in the lower abdomen (laparotomy), and the ectopic pregnancy is removed. This stops the bleeding. Part of the fallopian tube, or the whole tube, may be removed as well (salpingectomy). After surgery, pregnancy hormone tests may be done to be sure there is no pregnancy tissue left. You may receive an Rho(D) immune globulin shot if you are Rh negative and the father is Rh positive, or if you do not know the Rh type of the father. This is to prevent problems with any future pregnancy. SEEK IMMEDIATE MEDICAL CARE IF:  You have any symptoms of an ectopic pregnancy. This is a medical emergency. Document Released: 10/30/2004 Document Revised: 07/13/2013 Document Reviewed: 04/21/2013 [facility] Patient Information 2014 [place], [place]. PATIENT INSTRUCTIONS POST-ANESTHESIA  IMMEDIATELY FOLLOWING SURGERY:  Do not drive or  operate machinery for the first twenty four hours after surgery.  Do not make any important decisions for twenty four hours after surgery or while taking narcotic pain medications or sedatives.  If you develop intractable nausea and vomiting or a severe headache please notify your doctor immediately.  FOLLOW-UP:  Please make an appointment with your surgeon as instructed. You do not need to follow up with anesthesia unless specifically instructed to do so.  WOUND CARE INSTRUCTIONS (if applicable):  Keep a dry clean dressing on the anesthesia/puncture wound site if there is drainage.  Once the wound has quit draining you may leave it open to air.  Generally you should leave the bandage intact for twenty four hours unless there is drainage.  If the epidural site drains for more than 36-48 hours please call the anesthesia department.  QUESTIONS?:  Please feel free to call your physician or the hospital operator if you have any questions, and they will be happy to assist you.

## 2014-02-28 NOTE — Progress Notes (Signed)
Patient ID: Sydney Mccall, female   DOB: 12/17/1985, 28 y.o.   MRN: [id number] Preoperative History and Physical  Sydney Mccall is a 28 y.o. [id number] with Patient's last menstrual period was 02/14/2014. admitted for a laparoscopic excision of a left ectopic pregnancy and bilateral salpingectomy.  Had post partum BTL 08/24/2013 with sonographically proven ectopic on left 2.4 cm.  Wants to ensure sterilization so will proceed with bilateral salpingectomy  PMH:    Past Medical History  Diagnosis Date  . Genital herpes   . Pregnant   . Herpes genitalia   . Postpartum hemorrhage     history    PSH:     Past Surgical History  Procedure Laterality Date  . Breast cyst excision    . Tubal ligation Bilateral 08/24/2013    Procedure: POST PARTUM TUBAL LIGATION;  Surgeon: Mora Bellman, MD;  Location: [facility];  Service: Gynecology;  Laterality: Bilateral;    POb/GynH:      OB History   Grav Para Term Preterm Abortions TAB SAB Ect Mult Living   5 3 3  1  1   3       SH:   History  Substance Use Topics  . Smoking status: Never Smoker   . Smokeless tobacco: Never Used  . Alcohol Use: No    FH:   History reviewed. No pertinent family history.   Allergies:  Allergies  Allergen Reactions  . Iodine Anaphylaxis and Swelling    Throat itchy and swells     Medications:      Current outpatient prescriptions:fluconazole (DIFLUCAN) 150 MG tablet, Take 1 tablet (150 mg total) by mouth once., Disp: 1 tablet, Rfl: 4  Review of Systems:   Review of Systems  Constitutional: Negative for fever, chills, weight loss, malaise/fatigue and diaphoresis.  HENT: Negative for hearing loss, ear pain, nosebleeds, congestion, sore throat, neck pain, tinnitus and ear discharge.   Eyes: Negative for blurred vision, double vision, photophobia, pain, discharge and redness.  Respiratory: Negative for cough, hemoptysis, sputum production, shortness of breath, wheezing and stridor.   Cardiovascular:  Negative for chest pain, palpitations, orthopnea, claudication, leg swelling and PND.  Gastrointestinal: Positive for abdominal pain. Negative for heartburn, nausea, vomiting, diarrhea, constipation, blood in stool and melena.  Genitourinary: Negative for dysuria, urgency, frequency, hematuria and flank pain.  Musculoskeletal: Negative for myalgias, back pain, joint pain and falls.  Skin: Negative for itching and rash.  Neurological: Negative for dizziness, tingling, tremors, sensory change, speech change, focal weakness, seizures, loss of consciousness, weakness and headaches.  Endo/Heme/Allergies: Negative for environmental allergies and polydipsia. Does not bruise/bleed easily.  Psychiatric/Behavioral: Negative for depression, suicidal ideas, hallucinations, memory loss and substance abuse. The patient is not nervous/anxious and does not have insomnia.      PHYSICAL EXAM:  Blood pressure 110/68, height 5\' 3"  (1.6 m), weight 191 lb (86.637 kg), last menstrual period 02/14/2014, not currently breastfeeding.    Vitals reviewed. Constitutional: She is oriented to person, place, and time. She appears well-developed and well-nourished.  HENT:  Head: Normocephalic and atraumatic.  Right Ear: External ear normal.  Left Ear: External ear normal.  Nose: Nose normal.  Mouth/Throat: Oropharynx is clear and moist.  Eyes: Conjunctivae and EOM are normal. Pupils are equal, round, and reactive to light. Right eye exhibits no discharge. Left eye exhibits no discharge. No scleral icterus.  Neck: Normal range of motion. Neck supple. No tracheal deviation present. No thyromegaly present.  Cardiovascular: Normal rate, regular rhythm, normal heart  sounds and intact distal pulses.  Exam reveals no gallop and no friction rub.   No murmur heard. Respiratory: Effort normal and breath sounds normal. No respiratory distress. She has no wheezes. She has no rales. She exhibits no tenderness.  GI: Soft. Bowel  sounds are normal. She exhibits no distension and no mass. There is tenderness. There is no rebound and no guarding.  Genitourinary:       Vulva is normal without lesions Vagina is pink moist without discharge Cervix normal in appearance and pap is normal Uterus is normal siz3 Adnexa is significant for left ectopic  Musculoskeletal: Normal range of motion. She exhibits no edema and no tenderness.  Neurological: She is alert and oriented to person, place, and time. She has normal reflexes. She displays normal reflexes. No cranial nerve deficit. She exhibits normal muscle tone. Coordination normal.  Skin: Skin is warm and dry. No rash noted. No erythema. No pallor.  Psychiatric: She has a normal mood and affect. Her behavior is normal. Judgment and thought content normal.    Labs: Results for orders placed in visit on 02/23/14 (from the past 336 hour(s))  HCG, QUANTITATIVE, PREGNANCY   Collection Time    02/23/14 12:32 PM      Result Value Ref Range   hCG, Beta Chain, Quant, S 938.3      EKG: No orders found for this or any previous visit.  Imaging Studies: US Ob Transvaginal  02/28/2014   DATING AND VIABILITY SONOGRAM   Sydney Mccall is a 28 y.o. year old G17P3013.  She is here today for a  confirmatory sonogram. Pt presents with vaginal bleeding.  GESTATION:  CERVIX: Appears long and closed  ADNEXA: The ovaries are abnormal - Lt adnexa mass noted cephalic to Lt SJGGE=3.6 x  2.2cm.small amount of free fluid noted within posterior cul-de-sac   GESTATIONAL AGE AND  BIOMETRICS:  Gestational criteria: Estimated Date of Delivery: None noted.  Previous Scans:1  GESTATIONAL SAC            mm          weeks  CROWN RUMP LENGTH            mm          weeks                                                                               AVERAGE EGA(BY THIS SCAN):    weeks TECHNICIAN COMMENTS:  Anteverted uterus noted, endometrium appears thickened, Rt ovary appears  WNL, Lt ovary appears with C.L. And  mass noted cephalic to lt ovary- 2.4 x  2.2cm with small amount of free fluid noted within the posterior  [place]ac    A copy of this report including all images has been saved and backed up to  a second source for retrieval if needed. All measures and details of the  anatomical scan, placentation, fluid volume and pelvic anatomy are  contained in that report.  Alicia Amel 02/28/2014 10:09 AM  Left ectopic pregnancy after sterilization Unruptured  Florian Buff 02/28/2014 10:34 AM   US Ob Transvaginal  02/23/2014   DATING AND VIABILITY SONOGRAM   Sydney  Jerilynn Mages Mccall is a 28 y.o. year old [phone] with LMP ?She is here today  for a initial sonogram due to pt has a H/O BTL in 08-24-2013 and presents  with +UPT and LLQ pain. Pt had a Quantitative HCG drawn which was 938.3    GESTATION:  No gestational sac noted within the uterus  CERVIX: Appears long and closed   ADNEXA: The ovaries are normal.With C.L. Noted on LT, no free fluid noted within  the pelvis   GESTATIONAL AGE AND  BIOMETRICS:  Gestational criteria: Estimated Date of Delivery: None noted.     TECHNICIAN COMMENTS:  No gestational sac noted within the uterus, Endometrium appears thickened,  Rt ovary appears WNL, LT ovary appears with apparent C.L. Noted, no  adnexal masses or free fluid noted within the pelvis    A copy of this report including all images has been saved and backed up to  a second source for retrieval if needed. All measures and details of the  anatomical scan, placentation, fluid volume and pelvic anatomy are  contained in that report.  Alicia Amel 02/23/2014 3:57 PM  HCG 938 No definitive IUP or ectopic Physiologic corpus luteum cyst seen Certainly with history of post partum BTL, possibility of ectopic needs to  be closely evaluated Repeat HCG on Tuesday and sonogram based on that value  Florian Buff 02/23/2014 4:26 PM        Assessment: Patient Active Problem List   Diagnosis Date Noted  . Pregnant 02/23/2014  . NSVD (normal  spontaneous vaginal delivery) 08/24/2013  . HSV-2 infection complicating pregnancy 03/50/0938  . Late prenatal care complicating pregnancy in second trimester 03/24/2013  . Postpartum hemorrhage 07/13/2012  . KNEE JOINT INSTABILITY 08/13/2009    Plan: Laparoscopic excision of left ectopic and bilateral salpingectomy  Florian Buff 02/28/2014 10:47 AM

## 2014-03-01 ENCOUNTER — Encounter (HOSPITAL_COMMUNITY): Payer: Self-pay

## 2014-03-01 ENCOUNTER — Encounter (HOSPITAL_COMMUNITY): Payer: Medicaid Other | Admitting: Anesthesiology

## 2014-03-01 ENCOUNTER — Ambulatory Visit (HOSPITAL_COMMUNITY): Payer: Medicaid Other | Admitting: Anesthesiology

## 2014-03-01 ENCOUNTER — Telehealth: Payer: Self-pay | Admitting: Obstetrics & Gynecology

## 2014-03-01 ENCOUNTER — Encounter (HOSPITAL_COMMUNITY)
Admission: RE | Disposition: A | Discharge status: Home / Self Care | Payer: Self-pay | Source: Ambulatory Visit | Attending: Obstetrics & Gynecology

## 2014-03-01 ENCOUNTER — Ambulatory Visit (HOSPITAL_COMMUNITY)
Admission: RE | Admit: 2014-03-01 | Discharge: 2014-03-01 | Disposition: A | Discharge status: Home / Self Care | Payer: Medicaid Other | Source: Ambulatory Visit | Attending: Obstetrics & Gynecology | Admitting: Obstetrics & Gynecology

## 2014-03-01 DIAGNOSIS — O00109 Unspecified tubal pregnancy without intrauterine pregnancy: Secondary | ICD-10-CM | POA: Insufficient documentation

## 2014-03-01 DIAGNOSIS — K661 Hemoperitoneum: Secondary | ICD-10-CM | POA: Insufficient documentation

## 2014-03-01 DIAGNOSIS — K219 Gastro-esophageal reflux disease without esophagitis: Secondary | ICD-10-CM | POA: Insufficient documentation

## 2014-03-01 DIAGNOSIS — Z302 Encounter for sterilization: Secondary | ICD-10-CM | POA: Insufficient documentation

## 2014-03-01 DIAGNOSIS — R1032 Left lower quadrant pain: Secondary | ICD-10-CM | POA: Diagnosis not present

## 2014-03-01 DIAGNOSIS — D649 Anemia, unspecified: Secondary | ICD-10-CM | POA: Insufficient documentation

## 2014-03-01 DIAGNOSIS — O009 Unspecified ectopic pregnancy without intrauterine pregnancy: Secondary | ICD-10-CM

## 2014-03-01 DIAGNOSIS — Z01812 Encounter for preprocedural laboratory examination: Secondary | ICD-10-CM | POA: Insufficient documentation

## 2014-03-01 HISTORY — PX: DIAGNOSTIC LAPAROSCOPY WITH REMOVAL OF ECTOPIC PREGNANCY: SHX6449

## 2014-03-01 HISTORY — PX: LAPAROSCOPIC BILATERAL SALPINGECTOMY: SHX5889

## 2014-03-01 LAB — POCT I-STAT 4, (NA,K, GLUC, HGB,HCT)
GLUCOSE: 90 mg/dL (ref 70–99)
HEMATOCRIT: 32 % — AB (ref 36.0–46.0)
HEMOGLOBIN: 10.9 g/dL — AB (ref 12.0–15.0)
Potassium: 3.7 mEq/L (ref 3.7–5.3)
SODIUM: 140 meq/L (ref 137–147)

## 2014-03-01 SURGERY — LAPAROSCOPY, WITH ECTOPIC PREGNANCY SURGICAL TREATMENT
Anesthesia: General | Site: Abdomen | Laterality: Left

## 2014-03-01 MED ORDER — LIDOCAINE HCL 1 % IJ SOLN
INTRAMUSCULAR | Status: DC | PRN
  Administered 2014-03-01: 50 mg via INTRADERMAL

## 2014-03-01 MED ORDER — LACTATED RINGERS IV SOLN
INTRAVENOUS | Status: DC
  Administered 2014-03-01 (×2): via INTRAVENOUS

## 2014-03-01 MED ORDER — FENTANYL CITRATE 0.05 MG/ML IJ SOLN
INTRAMUSCULAR | Status: AC
  Filled 2014-03-01: qty 2

## 2014-03-01 MED ORDER — NEOSTIGMINE METHYLSULFATE 10 MG/10ML IV SOLN
INTRAVENOUS | Status: AC
  Filled 2014-03-01: qty 1

## 2014-03-01 MED ORDER — NEOSTIGMINE METHYLSULFATE 10 MG/10ML IV SOLN
INTRAVENOUS | Status: DC | PRN
Start: 2014-03-01 — End: 2014-03-01
  Administered 2014-03-01: 1 mg via INTRAVENOUS
  Administered 2014-03-01: 2 mg via INTRAVENOUS

## 2014-03-01 MED ORDER — CEFAZOLIN SODIUM-DEXTROSE 2-3 GM-% IV SOLR
2.0000 g | INTRAVENOUS | Status: AC
  Administered 2014-03-01: 2 g via INTRAVENOUS

## 2014-03-01 MED ORDER — SODIUM CHLORIDE 0.9 % IR SOLN
Status: DC | PRN
  Administered 2014-03-01: 1000 mL

## 2014-03-01 MED ORDER — PROPOFOL 10 MG/ML IV BOLUS
INTRAVENOUS | Status: DC | PRN
  Administered 2014-03-01: 120 mg via INTRAVENOUS

## 2014-03-01 MED ORDER — LIDOCAINE HCL (PF) 1 % IJ SOLN
INTRAMUSCULAR | Status: AC
  Filled 2014-03-01: qty 5

## 2014-03-01 MED ORDER — FENTANYL CITRATE 0.05 MG/ML IJ SOLN
INTRAMUSCULAR | Status: AC
  Filled 2014-03-01: qty 5

## 2014-03-01 MED ORDER — GLYCOPYRROLATE 0.2 MG/ML IJ SOLN
INTRAMUSCULAR | Status: DC | PRN
  Administered 2014-03-01 (×2): 0.2 mg via INTRAVENOUS

## 2014-03-01 MED ORDER — GLYCOPYRROLATE 0.2 MG/ML IJ SOLN
INTRAMUSCULAR | Status: AC
  Filled 2014-03-01: qty 1

## 2014-03-01 MED ORDER — MIDAZOLAM HCL 2 MG/2ML IJ SOLN
INTRAMUSCULAR | Status: AC
  Filled 2014-03-01: qty 2

## 2014-03-01 MED ORDER — KETOROLAC TROMETHAMINE 30 MG/ML IJ SOLN
30.0000 mg | Freq: Once | INTRAMUSCULAR | Status: AC
  Administered 2014-03-01: 30 mg via INTRAVENOUS

## 2014-03-01 MED ORDER — FENTANYL CITRATE 0.05 MG/ML IJ SOLN
25.0000 ug | INTRAMUSCULAR | Status: AC
  Administered 2014-03-01 (×2): 25 ug via INTRAVENOUS

## 2014-03-01 MED ORDER — BUPIVACAINE LIPOSOME 1.3 % IJ SUSP
INTRAMUSCULAR | Status: DC | PRN
  Administered 2014-03-01: 20 mL

## 2014-03-01 MED ORDER — GLYCOPYRROLATE 0.2 MG/ML IJ SOLN
INTRAMUSCULAR | Status: AC
  Filled 2014-03-01: qty 2

## 2014-03-01 MED ORDER — 0.9 % SODIUM CHLORIDE (POUR BTL) OPTIME
TOPICAL | Status: DC | PRN
  Administered 2014-03-01: 1000 mL

## 2014-03-01 MED ORDER — CEFAZOLIN SODIUM-DEXTROSE 2-3 GM-% IV SOLR
INTRAVENOUS | Status: AC
  Filled 2014-03-01: qty 50

## 2014-03-01 MED ORDER — ONDANSETRON HCL 4 MG/2ML IJ SOLN
INTRAMUSCULAR | Status: AC
  Filled 2014-03-01: qty 2

## 2014-03-01 MED ORDER — PROPOFOL 10 MG/ML IV EMUL
INTRAVENOUS | Status: AC
  Filled 2014-03-01: qty 20

## 2014-03-01 MED ORDER — FENTANYL CITRATE 0.05 MG/ML IJ SOLN
INTRAMUSCULAR | Status: DC | PRN
  Administered 2014-03-01 (×2): 100 ug via INTRAVENOUS
  Administered 2014-03-01: 50 ug via INTRAVENOUS

## 2014-03-01 MED ORDER — ONDANSETRON HCL 4 MG/2ML IJ SOLN
4.0000 mg | Freq: Once | INTRAMUSCULAR | Status: AC
  Administered 2014-03-01: 4 mg via INTRAVENOUS

## 2014-03-01 MED ORDER — MIDAZOLAM HCL 5 MG/5ML IJ SOLN
INTRAMUSCULAR | Status: DC | PRN
  Administered 2014-03-01: 2 mg via INTRAVENOUS

## 2014-03-01 MED ORDER — KETOROLAC TROMETHAMINE 30 MG/ML IJ SOLN
INTRAMUSCULAR | Status: AC
  Filled 2014-03-01: qty 1

## 2014-03-01 MED ORDER — ROCURONIUM BROMIDE 100 MG/10ML IV SOLN
INTRAVENOUS | Status: DC | PRN
  Administered 2014-03-01: 40 mg via INTRAVENOUS

## 2014-03-01 MED ORDER — SUCCINYLCHOLINE CHLORIDE 20 MG/ML IJ SOLN
INTRAMUSCULAR | Status: DC | PRN
  Administered 2014-03-01: 140 mg via INTRAVENOUS

## 2014-03-01 MED ORDER — OXYCODONE-ACETAMINOPHEN 7.5-325 MG PO TABS: 1.0000 | ORAL_TABLET | Freq: Four times a day (QID) | ORAL | Status: DC | PRN

## 2014-03-01 MED ORDER — KETOROLAC TROMETHAMINE 10 MG PO TABS: 10.0000 mg | ORAL_TABLET | Freq: Three times a day (TID) | ORAL | Status: DC | PRN

## 2014-03-01 MED ORDER — MIDAZOLAM HCL 2 MG/2ML IJ SOLN
1.0000 mg | INTRAMUSCULAR | Status: DC | PRN
  Administered 2014-03-01: 2 mg via INTRAVENOUS

## 2014-03-01 MED ORDER — GLYCOPYRROLATE 0.2 MG/ML IJ SOLN
0.2000 mg | Freq: Once | INTRAMUSCULAR | Status: AC
  Administered 2014-03-01: 0.2 mg via INTRAVENOUS

## 2014-03-01 MED ORDER — ONDANSETRON HCL 4 MG/2ML IJ SOLN: 4.0000 mg | Freq: Once | INTRAMUSCULAR | Status: DC | PRN

## 2014-03-01 MED ORDER — FENTANYL CITRATE 0.05 MG/ML IJ SOLN: 25.0000 ug | INTRAMUSCULAR | Status: DC | PRN

## 2014-03-01 MED ORDER — ONDANSETRON HCL 8 MG PO TABS: 8.0000 mg | ORAL_TABLET | Freq: Three times a day (TID) | ORAL | Status: DC | PRN

## 2014-03-01 SURGICAL SUPPLY — 56 items
APPLIER CLIP 5 13 M/L LIGAMAX5 (MISCELLANEOUS) ×3
APPLIER CLIP LAPSCP 10X32 DD (CLIP) IMPLANT
APR CLP MED LRG 5 ANG JAW (MISCELLANEOUS) ×2
BAG HAMPER (MISCELLANEOUS) ×3 IMPLANT
BAG SPEC RTRVL LRG 6X4 10 (ENDOMECHANICALS) ×2
BLADE 11 SAFETY STRL DISP (BLADE) ×3 IMPLANT
BLADE SURG SZ11 CARB STEEL (BLADE) ×3 IMPLANT
CLIP APPLIE 5 13 M/L LIGAMAX5 (MISCELLANEOUS) ×2 IMPLANT
CLOTH BEACON ORANGE TIMEOUT ST (SAFETY) ×3 IMPLANT
COVER LIGHT HANDLE STERIS (MISCELLANEOUS) ×6 IMPLANT
DRAPE PROXIMA HALF (DRAPES) ×3 IMPLANT
ELECT REM PT RETURN 9FT ADLT (ELECTROSURGICAL) ×3
ELECTRODE REM PT RTRN 9FT ADLT (ELECTROSURGICAL) ×2 IMPLANT
FILTER SMOKE EVAC LAPAROSHD (FILTER) ×3 IMPLANT
FORMALIN 10 PREFIL 480ML (MISCELLANEOUS) ×2 IMPLANT
GAUZE SPONGE 4X4 16PLY XRAY LF (GAUZE/BANDAGES/DRESSINGS) IMPLANT
GLOVE BIOGEL PI IND STRL 7.0 (GLOVE) IMPLANT
GLOVE BIOGEL PI IND STRL 8 (GLOVE) ×2 IMPLANT
GLOVE BIOGEL PI INDICATOR 7.0 (GLOVE) ×1
GLOVE BIOGEL PI INDICATOR 8 (GLOVE) ×1
GLOVE ECLIPSE 6.5 STRL STRAW (GLOVE) ×1 IMPLANT
GLOVE ECLIPSE 8.0 STRL XLNG CF (GLOVE) ×3 IMPLANT
GLOVE EXAM NITRILE LRG STRL (GLOVE) ×1 IMPLANT
GLOVE EXAM NITRILE MD LF STRL (GLOVE) ×1 IMPLANT
GOWN STRL REUS W/TWL LRG LVL3 (GOWN DISPOSABLE) ×3 IMPLANT
GOWN STRL REUS W/TWL XL LVL3 (GOWN DISPOSABLE) ×3 IMPLANT
INST SET LAPROSCOPIC GYN AP (KITS) ×3 IMPLANT
IV NS IRRIG 3000ML ARTHROMATIC (IV SOLUTION) ×3 IMPLANT
KIT ROOM TURNOVER APOR (KITS) ×3 IMPLANT
MANIFOLD NEPTUNE II (INSTRUMENTS) ×3 IMPLANT
NDL HYPO 18GX1.5 BLUNT FILL (NEEDLE) ×2 IMPLANT
NDL HYPO 21X1.5 SAFETY (NEEDLE) IMPLANT
NDL HYPO 25X1 1.5 SAFETY (NEEDLE) ×2 IMPLANT
NEEDLE HYPO 18GX1.5 BLUNT FILL (NEEDLE) ×3 IMPLANT
NEEDLE HYPO 21X1.5 SAFETY (NEEDLE) ×3 IMPLANT
NEEDLE HYPO 25X1 1.5 SAFETY (NEEDLE) ×3 IMPLANT
NEEDLE INSUFFLATION 120MM (ENDOMECHANICALS) ×3 IMPLANT
PACK PERI GYN (CUSTOM PROCEDURE TRAY) ×3 IMPLANT
PAD ARMBOARD 7.5X6 YLW CONV (MISCELLANEOUS) ×3 IMPLANT
POUCH SPECIMEN RETRIEVAL 10MM (ENDOMECHANICALS) ×1 IMPLANT
SCALPEL HARMONIC ACE (MISCELLANEOUS) ×3 IMPLANT
SET BASIN LINEN APH (SET/KITS/TRAYS/PACK) ×3 IMPLANT
SET TUBE IRRIG SUCTION NO TIP (IRRIGATION / IRRIGATOR) ×3 IMPLANT
SOLUTION ANTI FOG 6CC (MISCELLANEOUS) ×3 IMPLANT
SPONGE GAUZE 2X2 8PLY STRL LF (GAUZE/BANDAGES/DRESSINGS) ×3 IMPLANT
STAPLER VISISTAT 35W (STAPLE) ×3 IMPLANT
SUT VICRYL 0 UR6 27IN ABS (SUTURE) ×3 IMPLANT
SYR 20CC LL (SYRINGE) ×3 IMPLANT
SYRINGE 10CC LL (SYRINGE) ×3 IMPLANT
TAPE CLOTH SURG 4X10 WHT LF (GAUZE/BANDAGES/DRESSINGS) ×1 IMPLANT
TRAY FOLEY CATH 16FR SILVER (SET/KITS/TRAYS/PACK) ×3 IMPLANT
TROCAR ENDO BLADELESS 11MM (ENDOMECHANICALS) ×3 IMPLANT
TROCAR SLEEVE XCEL 5X75 (ENDOMECHANICALS) ×3 IMPLANT
TROCAR XCEL NON-BLD 5MMX100MML (ENDOMECHANICALS) ×3 IMPLANT
TUBING INSUF HEATED (TUBING) ×3 IMPLANT
WARMER LAPAROSCOPE (MISCELLANEOUS) ×3 IMPLANT

## 2014-03-01 NOTE — Discharge Instructions (Signed)
Ruptured Ectopic Pregnancy An ectopic pregnancy is when the fertilized egg attaches (implants) outside the uterus. Most ectopic pregnancies occur in the fallopian tube. Rarely do ectopic pregnancies occur on the ovary, intestine, pelvis, or cervix. An ectopic pregnancy does not have the ability to develop into a normal, healthy baby.  A ruptured ectopic pregnancy is one in which the fallopian tube gets torn or bursts and results in internal bleeding. Often there is intense abdominal pain, and sometimes, vaginal bleeding. Having an ectopic pregnancy can be a life-threatening experience. If left untreated, this dangerous condition can lead to a blood transfusion, abdominal surgery, or even death.  CAUSES  Damage to the fallopian tubes is the suspected cause in most ectopic pregnancies.  RISK FACTORS Depending on your circumstances, the amount of risk of having an ectopic pregnancy will vary. There are 3 categories that may help you identify whether you are potentially at risk. High Risk  You have gone through infertility treatment.  You have had a previous ectopic pregnancy.  You have had previous tubal surgery.  You have had previous surgery to have the fallopian tubes tied (tubal ligation).  You have tubal problems or diseases.  You have been exposed to DES. DES is a medicine that was used until 1971 and had effects on babies whose mothers took the medicine.  You become pregnant while using an intrauterine device (IUD) for birth control. Moderate Risk  You have a history of infertility.  You have a history of a sexually transmitted infection (STI).  You have a history of pelvic inflammatory disease (PID).  You have scarring from endometriosis.  You have multiple sexual partners.  You smoke. Low Risk  You have had previous pelvic surgery.  You use vaginal douching.  You became sexually active before 29 years of age. SYMPTOMS An ectopic pregnancy should be suspected in  anyone who has missed a period and has abdominal pain or bleeding.  You may experience normal pregnancy symptoms, such as:  Nausea.  Tiredness.  Breast tenderness.  Symptoms that are not normal include:  Pain with intercourse.  Irregular vaginal bleeding or spotting.  Cramping or pain on one side, or in the lower abdomen.  Fast heartbeat.  Passing out while having a bowel movement.  Symptoms of a ruptured ectopic pregnancy and internal bleeding may include:  Sudden, severe pain in the abdomen and pelvis.  Dizziness or fainting.  Pain in the shoulder area. DIAGNOSIS  Tests that may be performed include:  A pregnancy test.  An ultrasound.  Testing the specific level of pregnancy hormone in the bloodstream.  Taking a sample of uterus tissue (dilation and curettage, D&C).  Surgery to perform a visual exam of the inside of the abdomen using a lighted tube (laparoscopy). TREATMENT  Laparoscopic surgery or abdominal surgery is recommended for a ruptured ectopic pregnancy.   The whole fallopian tube may need to be removed (salpingectomy).  If the tube is not too damaged, the tube may be saved, and the pregnancy will be surgically removed. Intime, the tube may still function.  If you have lost a lot of blood, you may need a blood transfusion.  You may receive a RhoGAM shot if you are Rh negative and the father is Rh positive, or if you do not know the Rh type of the father. This is to prevent problems with any future pregnancy. SEEK IMMEDIATE MEDICAL CARE IF:  You have any symptoms of an ectopic or ruptured ectopic pregnancy. This is a medical  emergency. Document Released: 09/19/2000 Document Revised: 12/15/2011 Document Reviewed: 07/04/2013 [facility] Patient Information 2014 [place], [place].

## 2014-03-01 NOTE — Interval H&P Note (Signed)
History and Physical Interval Note:  03/01/2014 9:33 AM  [name]  has presented today for surgery, with the diagnosis of ectopic pregnancy  The various methods of treatment have been discussed with the patient and family. After consideration of risks, benefits and other options for treatment, the patient has consented to  Procedure(s): LAPAROSCOPIC REMOVAL OF ECTOPIC PREGNANCY (Left) LAPAROSCOPIC BILATERAL SALPINGECTOMY (Bilateral) as a surgical intervention .  The patient's history has been reviewed, patient examined, no change in status, stable for surgery.  I have reviewed the patient's chart and labs.  Questions were answered to the patient's satisfaction.     Florian Buff

## 2014-03-01 NOTE — Transfer of Care (Signed)
Immediate Anesthesia Transfer of Care Note  Patient: ALANNAH AVERHART  Procedure(s) Performed: Procedure(s): LAPAROSCOPIC REMOVAL OF LEFT ECTOPIC PREGNANCY (Left) LAPAROSCOPIC BILATERAL SALPINGECTOMY (Bilateral)  Patient Location: PACU  Anesthesia Type:General  Level of Consciousness: awake, alert  and patient cooperative  Airway & Oxygen Therapy: Patient Spontanous Breathing and Patient connected to face mask oxygen  Post-op Assessment: Report given to PACU RN, Post -op Vital signs reviewed and stable and Patient moving all extremities  Post vital signs: Reviewed and stable  Complications: No apparent anesthesia complications

## 2014-03-01 NOTE — Op Note (Signed)
While assessing patient and starting her I.V patient complained of sharp pain in her bottom rating a 4 on the pain scale. Patient also, said when she went to bathroom she passed a large clot and more bleeding than usual. Vital signs remained normal . Dr. Elonda Husky notified in [place] and Dr. Patsey Berthold ordered an Bethena Roys

## 2014-03-01 NOTE — H&P (Signed)
Preoperative History and Physical  Sydney Mccall is a 28 y.o. [phone] with Patient's last menstrual period was 02/14/2014. admitted for a laparoscopic excision of a left ectopic pregnancy and bilateral salpingectomy.  Had post partum BTL 08/24/2013 with sonographically proven ectopic on left 2.4 cm. Wants to ensure sterilization so will proceed with bilateral salpingectomy  PMH:  Past Medical History   Diagnosis  Date   .  Genital herpes    .  Pregnant    .  Herpes genitalia    .  Postpartum hemorrhage      history   PSH:  Past Surgical History   Procedure  Laterality  Date   .  Breast cyst excision     .  Tubal ligation  Bilateral  08/24/2013     Procedure: POST PARTUM TUBAL LIGATION; Surgeon: Mora Bellman, MD; Location: [facility]; Service: Gynecology; Laterality: Bilateral;   POb/GynH:  OB History    Grav  Para  Term  Preterm  Abortions  TAB  SAB  Ect  Mult  Living    5  3  3   1   1    3      SH:  History   Substance Use Topics   .  Smoking status:  Never Smoker   .  Smokeless tobacco:  Never Used   .  Alcohol Use:  No   FH: History reviewed. No pertinent family history.  Allergies:  Allergies   Allergen  Reactions   .  Iodine  Anaphylaxis and Swelling     Throat itchy and swells   Medications: Current outpatient prescriptions:fluconazole (DIFLUCAN) 150 MG tablet, Take 1 tablet (150 mg total) by mouth once., Disp: 1 tablet, Rfl: 4  Review of Systems:  Review of Systems  Constitutional: Negative for fever, chills, weight loss, malaise/fatigue and diaphoresis.  HENT: Negative for hearing loss, ear pain, nosebleeds, congestion, sore throat, neck pain, tinnitus and ear discharge.  Eyes: Negative for blurred vision, double vision, photophobia, pain, discharge and redness.  Respiratory: Negative for cough, hemoptysis, sputum production, shortness of breath, wheezing and stridor.  Cardiovascular: Negative for chest pain, palpitations, orthopnea, claudication, leg swelling and  PND.  Gastrointestinal: Positive for abdominal pain. Negative for heartburn, nausea, vomiting, diarrhea, constipation, blood in stool and melena.  Genitourinary: Negative for dysuria, urgency, frequency, hematuria and flank pain.  Musculoskeletal: Negative for myalgias, back pain, joint pain and falls.  Skin: Negative for itching and rash.  Neurological: Negative for dizziness, tingling, tremors, sensory change, speech change, focal weakness, seizures, loss of consciousness, weakness and headaches.  Endo/Heme/Allergies: Negative for environmental allergies and polydipsia. Does not bruise/bleed easily.  Psychiatric/Behavioral: Negative for depression, suicidal ideas, hallucinations, memory loss and substance abuse. The patient is not nervous/anxious and does not have insomnia.  PHYSICAL EXAM:  Blood pressure 110/68, height 5\' 3"  (1.6 m), weight 191 lb (86.637 kg), last menstrual period 02/14/2014, not currently breastfeeding.  Vitals reviewed.  Constitutional: She is oriented to person, place, and time. She appears well-developed and well-nourished.  HENT:  Head: Normocephalic and atraumatic.  Right Ear: External ear normal.  Left Ear: External ear normal.  Nose: Nose normal.  Mouth/Throat: Oropharynx is clear and moist.  Eyes: Conjunctivae and EOM are normal. Pupils are equal, round, and reactive to light. Right eye exhibits no discharge. Left eye exhibits no discharge. No scleral icterus.  Neck: Normal range of motion. Neck supple. No tracheal deviation present. No thyromegaly present.  Cardiovascular: Normal rate, regular rhythm, normal heart sounds  and intact distal pulses. Exam reveals no gallop and no friction rub.  No murmur heard.  Respiratory: Effort normal and breath sounds normal. No respiratory distress. She has no wheezes. She has no rales. She exhibits no tenderness.  GI: Soft. Bowel sounds are normal. She exhibits no distension and no mass. There is tenderness. There is no  rebound and no guarding.  Genitourinary:  Vulva is normal without lesions Vagina is pink moist without discharge Cervix normal in appearance and pap is normal Uterus is normal siz3 Adnexa is significant for left ectopic  Musculoskeletal: Normal range of motion. She exhibits no edema and no tenderness.  Neurological: She is alert and oriented to person, place, and time. She has normal reflexes. She displays normal reflexes. No cranial nerve deficit. She exhibits normal muscle tone. Coordination normal.  Skin: Skin is warm and dry. No rash noted. No erythema. No pallor.  Psychiatric: She has a normal mood and affect. Her behavior is normal. Judgment and thought content normal.  Labs:  Results for orders placed in visit on 02/23/14 (from the past 336 hour(s))   HCG, QUANTITATIVE, PREGNANCY    Collection Time    02/23/14 12:32 PM   Result  Value  Ref Range    hCG, Beta Chain, Quant, S  938.3    EKG:  No orders found for this or any previous visit.  Imaging Studies:  US Ob Transvaginal  02/28/2014 DATING AND VIABILITY SONOGRAM Sydney Mccall is a 28 y.o. year old G64P3013. She is here today for a confirmatory sonogram. Pt presents with vaginal bleeding. GESTATION: CERVIX: Appears long and closed ADNEXA: The ovaries are abnormal - Lt adnexa mass noted cephalic to Lt JKDTO=6.7 x 2.2cm.small amount of free fluid noted within posterior cul-de-sac GESTATIONAL AGE AND BIOMETRICS: Gestational criteria: Estimated Date of Delivery: None noted. Previous Scans:1 GESTATIONAL SAC mm weeks CROWN RUMP LENGTH mm weeks AVERAGE EGA(BY THIS SCAN): weeks TECHNICIAN COMMENTS: Anteverted uterus noted, endometrium appears thickened, Rt ovary appears WNL, Lt ovary appears with C.L. And mass noted cephalic to lt ovary- 2.4 x 2.2cm with small amount of free fluid noted within the posterior [place] A copy of this report including all images has been saved and backed up to a second source for retrieval if needed. All  measures and details of the anatomical scan, placentation, fluid volume and pelvic anatomy are contained in that report. Sydney Mccall 02/28/2014 10:09 AM Left ectopic pregnancy after sterilization Unruptured Florian Buff 02/28/2014 10:34 AM  US Ob Transvaginal  02/23/2014 DATING AND VIABILITY SONOGRAM Sydney Mccall is a 28 y.o. year old G44P3013 with LMP ?She is here today for a initial sonogram due to pt has a H/O BTL in 08-24-2013 and presents with +UPT and LLQ pain. Pt had a Quantitative HCG drawn which was 938.3 GESTATION: No gestational sac noted within the uterus CERVIX: Appears long and closed ADNEXA: The ovaries are normal.With C.L. Noted on LT, no free fluid noted within the pelvis GESTATIONAL AGE AND BIOMETRICS: Gestational criteria: Estimated Date of Delivery: None noted. TECHNICIAN COMMENTS: No gestational sac noted within the uterus, Endometrium appears thickened, Rt ovary appears WNL, LT ovary appears with apparent C.L. Noted, no adnexal masses or free fluid noted within the pelvis A copy of this report including all images has been saved and backed up to a second source for retrieval if needed. All measures and details of the anatomical scan, placentation, fluid volume and pelvic anatomy are contained in that report. Norton Pastel  Lauro Regulus 02/23/2014 3:57 PM HCG 938 No definitive IUP or ectopic Physiologic corpus luteum cyst seen Certainly with history of post partum BTL, possibility of ectopic needs to be closely evaluated Repeat HCG on Tuesday and sonogram based on that value Florian Buff 02/23/2014 4:26 PM  Assessment:  Left ectopic pregnancy, unruptured Desires permanent sterilization   Patient Active Problem List    Diagnosis  Date Noted   .  Pregnant  02/23/2014   .  NSVD (normal spontaneous vaginal delivery)  08/24/2013   .  HSV-2 infection complicating pregnancy  76/19/5093   .  Late prenatal care complicating pregnancy in second trimester  03/24/2013   .  Postpartum hemorrhage   07/13/2012   .  KNEE JOINT INSTABILITY  08/13/2009   Plan:  Laparoscopic excision of left ectopic and bilateral salpingectomy   Florian Buff 03/01/2014 8:12 AM

## 2014-03-01 NOTE — Anesthesia Postprocedure Evaluation (Signed)
  Anesthesia Post-op Note  Patient: Sydney Mccall  Procedure(s) Performed: Procedure(s): LAPAROSCOPIC REMOVAL OF LEFT ECTOPIC PREGNANCY (Left) LAPAROSCOPIC BILATERAL SALPINGECTOMY (Bilateral)  Patient Location: PACU  Anesthesia Type:General  Level of Consciousness: awake, alert , oriented and patient cooperative  Airway and Oxygen Therapy: Patient Spontanous Breathing  Post-op Pain: 2 /10, mild  Post-op Assessment: Post-op Vital signs reviewed, Patient's Cardiovascular Status Stable, Respiratory Function Stable, Patent Airway, No signs of Nausea or vomiting and Pain level controlled  Post-op Vital Signs: Reviewed and stable  Last Vitals:  Filed Vitals:   03/01/14 1005  BP: 103/62  Pulse:   Temp:   Resp: 26    Complications: No apparent anesthesia complications

## 2014-03-01 NOTE — Anesthesia Procedure Notes (Signed)
Procedure Name: Intubation Date/Time: 03/01/2014 10:23 AM Performed by: Charmaine Downs Pre-anesthesia Checklist: Patient being monitored, Suction available, Emergency Drugs available and Patient identified Patient Re-evaluated:Patient Re-evaluated prior to inductionPreoxygenation: Pre-oxygenation with 100% oxygen Intubation Type: IV induction, Cricoid Pressure applied and Rapid sequence Ventilation: Oral airway inserted - appropriate to patient size and Mask ventilation without difficulty Laryngoscope Size: Mac and 3 Grade View: Grade I Tube type: Oral Tube size: 7.0 mm Number of attempts: 1 Airway Equipment and Method: Stylet Placement Confirmation: ETT inserted through vocal cords under direct vision,  positive ETCO2 and breath sounds checked- equal and bilateral Secured at: 21 cm Tube secured with: Tape Dental Injury: Teeth and Oropharynx as per pre-operative assessment

## 2014-03-01 NOTE — Telephone Encounter (Signed)
Pt states had surgery this am with Dr. Elonda Husky has question concerning her tubal ligation procedure and "would like to get something in writing in regards to tubal ligation procedure not done correctly." Pt informed Dr. Elonda Husky left for the day will be in office tomorrow.

## 2014-03-01 NOTE — Anesthesia Preprocedure Evaluation (Signed)
Anesthesia Evaluation  Patient identified by MRN, date of birth, ID band Patient awake    Reviewed: Allergy & Precautions, H&P , NPO status , Patient's Chart, lab work & pertinent test results, reviewed documented beta blocker date and time   History of Anesthesia Complications Negative for: history of anesthetic complications  Airway Mallampati: III TM Distance: >3 FB Neck ROM: full    Dental  (+) Teeth Intact   Pulmonary neg pulmonary ROS,  breath sounds clear to auscultation        Cardiovascular negative cardio ROS  Rhythm:regular Rate:Normal     Neuro/Psych negative neurological ROS  negative psych ROS   GI/Hepatic Neg liver ROS, GERD- (with pregnancy)  Medicated,  Endo/Other  BMI 38  Renal/GU negative Renal ROS  negative genitourinary   Musculoskeletal   Abdominal   Peds  Hematology  (+) anemia ,   Anesthesia Other Findings NPO since 2200  Reproductive/Obstetrics (+) Pregnancy (s/p SVD at 0503) and Breast feeding                            Anesthesia Physical Anesthesia Plan  ASA: II  Anesthesia Plan: General   Post-op Pain Management:    Induction: Intravenous, Rapid sequence and Cricoid pressure planned  Airway Management Planned:   Additional Equipment:   Intra-op Plan:   Post-operative Plan: Extubation in OR  Informed Consent: I have reviewed the patients History and Physical, chart, labs and discussed the procedure including the risks, benefits and alternatives for the proposed anesthesia with the patient or authorized representative who has indicated his/her understanding and acceptance.     Plan Discussed with:   Anesthesia Plan Comments:         Anesthesia Quick Evaluation

## 2014-03-01 NOTE — Op Note (Signed)
Preoperative diagnosis: Left ectopic pregnancy,2.4 cm, unruptured  Postoperative diagnosis: Ruptured left ectopic pregnancy with 500 cc hemoperitoneum   Procedure: Laparoscopic removal of left ectopic pregnancy via left salpingectomy, right salpingectomy as well   Surgeon: Florian Buff   Anesthesia: Gen. Endotracheal   Findings:   The patient underwent a modified [place] bilateral lateral tubal ligation in 08/25/2013 as requested for a postpartum tubal ligation.  Pathology report has been reviewed and reveals 1.6 and 1.5 cm segments of tube from each side including the left.  However patient presented to [place] last week with a positive pregnancy test and quantitative hCG and ultrasound determinations done serially reveal a rising hCG and ultimately yesterday a 2.4 cm left tubal pregnancy.  There was no free fluid in the pelvis and the patient was not having any symptoms some vaginal bleeding so it was considered unruptured.  I then scheduled her for laparoscopic removal of the ectopic and bilateral salpingectomy for today.  Interestingly when she went to the bathroom while in the preoperative area she began having severe left lower quadrant pain and had a large amount of blood per vagina.  I suspected that coincidentally she suffered a rupture of the ectopic.  intraoperatively the patient had about 500 cc of mostly clotted blood there was a small trickle coming from the ectopic but nothing significant. The entire lateral portion of the tube was ruptured. The right fallopian tube appeared to be normal without any evidence of hydrosalpinx adhesions and the fimbria were normal.   Description of operation: Patient was taken to the operating room and placed in the supine position where she underwent general endotracheal anesthesia. She was placed in dorsal lithotomy position. She was prepped and draped in usual sterile fashion. Foley catheter was placed. Incision was made in the umbilicus and a varies  needle was placed peritoneal cavity with one pass that difficulty. The peritoneal cavity was insufflated. A 1011 non-bladed trocar was placed using a video laparoscope under direct visualization without difficulty. An incision was made in the midline suprapubic area and left lower quadrant and 5 mm non-bladed trochars were placed in each site without difficulty under direct visualization. There was a significant amount of blood in the belly the time of surgery and I estimated at 500 cc judging by what I was able to suction out and the little remaining that I could not.   The left fallopian tube had been ruptured. Harmonic scalpel was used and a salpingectomy was performed. There was good hemostasis. The pelvis was irrigated and hemostasis once again confirmed. The right fallopian tube was completely normal and there were no other intraperitoneal abnormalities appreciated. The harmonic scalpel was used and the right fallopian tube was also removed with good hemostasis resulting.     An Endo Catch bag was placed and the left fallopian tube with the ectopic and the right fallopian tubes were both removed and sent separately to pathology for evaluation.  Again the pedicles were examined and found to be hemostatic.  The trochars were removed and the gas was allowed to escape from the abdomen. The 25 mm trocar sites were closed with staples and injected with a total of 10 cc of exparel. The umbilical fascia was closed with single 2-0 Vicryl suture and the subcutaneous tissue was also closed using Vicryl. The skin was closed using skin staples. 10 cc of expael was injected here as well.   The patient remained hemodynamically stable throughout the entire procedure was awakened from anesthesia and taken  to the recovery room in good stable condition with all counts being correct.   She received 2 g of Ancef and 30 mg of Toradol prophylactically. There was no real intraoperative blood loss only the hemoperitoneum  which was appreciated and present upon peritoneal entry.   Florian Buff 03/01/2014 11:22 AM

## 2014-03-02 ENCOUNTER — Encounter (HOSPITAL_COMMUNITY): Payer: Self-pay | Admitting: Obstetrics & Gynecology

## 2014-03-02 ENCOUNTER — Telehealth: Payer: Self-pay | Admitting: Obstetrics & Gynecology

## 2014-03-02 NOTE — Telephone Encounter (Signed)
Pt informed Dr. Elonda Husky did not get to office till 10 am and message was route to him yesterday after he left the office. Pt should hear back from our office today.

## 2014-03-03 NOTE — Telephone Encounter (Signed)
I called and talked with pt regarding her pathology report from her tubal ligation back in November and told her tubal segments were removed from each side and all is appropriate, all questions were answered

## 2014-03-06 ENCOUNTER — Telehealth: Payer: Self-pay | Admitting: Obstetrics & Gynecology

## 2014-03-06 NOTE — Telephone Encounter (Signed)
Pt states had surgery with Dr. Elonda Husky on 03/01/2014 for ectopic pregnancy, c/o vaginal bleeding and clots "the size of her pinkie." pt informed WNL and to keep her appt for 03/08/2014.

## 2014-03-07 ENCOUNTER — Encounter (HOSPITAL_COMMUNITY): Payer: Self-pay | Admitting: Emergency Medicine

## 2014-03-07 ENCOUNTER — Telehealth: Payer: Self-pay

## 2014-03-07 ENCOUNTER — Emergency Department (HOSPITAL_COMMUNITY)
Admission: EM | Admit: 2014-03-07 | Discharge: 2014-03-08 | Disposition: A | Discharge status: Home / Self Care | Payer: Medicaid Other | Attending: Emergency Medicine | Admitting: Emergency Medicine

## 2014-03-07 ENCOUNTER — Emergency Department (HOSPITAL_COMMUNITY): Payer: Medicaid Other

## 2014-03-07 DIAGNOSIS — D649 Anemia, unspecified: Secondary | ICD-10-CM

## 2014-03-07 DIAGNOSIS — Z8619 Personal history of other infectious and parasitic diseases: Secondary | ICD-10-CM | POA: Insufficient documentation

## 2014-03-07 DIAGNOSIS — R109 Unspecified abdominal pain: Secondary | ICD-10-CM | POA: Insufficient documentation

## 2014-03-07 DIAGNOSIS — Z79899 Other long term (current) drug therapy: Secondary | ICD-10-CM | POA: Insufficient documentation

## 2014-03-07 LAB — CBC WITH DIFFERENTIAL/PLATELET
Basophils Absolute: 0 10*3/uL (ref 0.0–0.1)
Basophils Relative: 1 % (ref 0–1)
Eosinophils Absolute: 0.1 10*3/uL (ref 0.0–0.7)
Eosinophils Relative: 2 % (ref 0–5)
HCT: 26.4 % — ABNORMAL LOW (ref 36.0–46.0)
HEMOGLOBIN: 8.7 g/dL — AB (ref 12.0–15.0)
LYMPHS ABS: 1.2 10*3/uL (ref 0.7–4.0)
Lymphocytes Relative: 22 % (ref 12–46)
MCH: 29.1 pg (ref 26.0–34.0)
MCHC: 33 g/dL (ref 30.0–36.0)
MCV: 88.3 fL (ref 78.0–100.0)
MONO ABS: 0.7 10*3/uL (ref 0.1–1.0)
MONOS PCT: 13 % — AB (ref 3–12)
NEUTROS ABS: 3.6 10*3/uL (ref 1.7–7.7)
Neutrophils Relative %: 62 % (ref 43–77)
Platelets: 192 10*3/uL (ref 150–400)
RBC: 2.99 MIL/uL — AB (ref 3.87–5.11)
RDW: 14.3 % (ref 11.5–15.5)
WBC: 5.8 10*3/uL (ref 4.0–10.5)

## 2014-03-07 LAB — URINALYSIS, ROUTINE W REFLEX MICROSCOPIC
BILIRUBIN URINE: NEGATIVE
Glucose, UA: NEGATIVE mg/dL
Hgb urine dipstick: NEGATIVE
KETONES UR: NEGATIVE mg/dL
Leukocytes, UA: NEGATIVE
NITRITE: NEGATIVE
Protein, ur: NEGATIVE mg/dL
Specific Gravity, Urine: 1.025 (ref 1.005–1.030)
Urobilinogen, UA: 0.2 mg/dL (ref 0.0–1.0)
pH: 6 (ref 5.0–8.0)

## 2014-03-07 LAB — COMPREHENSIVE METABOLIC PANEL
ALK PHOS: 73 U/L (ref 39–117)
ALT: 9 U/L (ref 0–35)
AST: 15 U/L (ref 0–37)
Albumin: 3.6 g/dL (ref 3.5–5.2)
BILIRUBIN TOTAL: 0.4 mg/dL (ref 0.3–1.2)
BUN: 13 mg/dL (ref 6–23)
CHLORIDE: 103 meq/L (ref 96–112)
CO2: 25 meq/L (ref 19–32)
Calcium: 9.4 mg/dL (ref 8.4–10.5)
Creatinine, Ser: 0.83 mg/dL (ref 0.50–1.10)
GFR calc Af Amer: >90 mL/min (ref >90)
GFR calc non Af Amer: >90 mL/min (ref >90)
Glucose, Bld: 86 mg/dL (ref 70–99)
Potassium: 4.2 mEq/L (ref 3.7–5.3)
Sodium: 139 mEq/L (ref 137–147)
Total Protein: 7.9 g/dL (ref 6.0–8.3)

## 2014-03-07 LAB — LIPASE, BLOOD: LIPASE: 18 U/L (ref 11–59)

## 2014-03-07 MED ORDER — SODIUM CHLORIDE 0.9 % IV SOLN: 1000.0000 mL | INTRAVENOUS | Status: DC

## 2014-03-07 MED ORDER — SODIUM CHLORIDE 0.9 % IV SOLN: 1000.0000 mL | Freq: Once | INTRAVENOUS | Status: DC

## 2014-03-07 MED ORDER — ONDANSETRON HCL 4 MG/2ML IJ SOLN: 4.0000 mg | Freq: Once | INTRAMUSCULAR | Status: DC

## 2014-03-07 NOTE — ED Notes (Signed)
abd pain, Had surgery for ectopic preg on 5/27 by Dr Elonda Husky.  Feel abd is swollen.  Normal bm today,  No NVD.

## 2014-03-07 NOTE — ED Provider Notes (Signed)
CSN: [id number]     Arrival date & time 03/07/14  1736 History   First MD Initiated Contact with Patient 03/07/14 1756     Chief Complaint  Patient presents with  . Abdominal Pain    HPI Pt had ectopic surgery on 5/27.  She had bilateral salpingectomy.  Pt has been having lower abdominal cramping.  Her abdomen feels bloated and swollen.  No vomiting or diarrhea.  She had been taking percocet but stopped.   She did have a bowel movement today.  No vaginal bleeding since yesterday.  She tried to call her doctor but he is on vacation.  She has an appointment tomorrow.  Appetite has been fine. Past Medical History  Diagnosis Date  . Genital herpes   . Pregnant   . Herpes genitalia   . Postpartum hemorrhage     history   Past Surgical History  Procedure Laterality Date  . Breast cyst excision    . Tubal ligation Bilateral 08/24/2013    Procedure: POST PARTUM TUBAL LIGATION;  Surgeon: Mora Bellman, MD;  Location: [facility];  Service: Gynecology;  Laterality: Bilateral;  . Diagnostic laparoscopy with removal of ectopic pregnancy Left 03/01/2014    Procedure: LAPAROSCOPIC REMOVAL OF LEFT ECTOPIC PREGNANCY;  Surgeon: Florian Buff, MD;  Location: [facility];  Service: Gynecology;  Laterality: Left;  . Laparoscopic bilateral salpingectomy Bilateral 03/01/2014    Procedure: LAPAROSCOPIC BILATERAL SALPINGECTOMY;  Surgeon: Florian Buff, MD;  Location: [facility];  Service: Gynecology;  Laterality: Bilateral;   History reviewed. No pertinent family history. History  Substance Use Topics  . Smoking status: Never Smoker   . Smokeless tobacco: Never Used  . Alcohol Use: No   OB History   Grav Para Term Preterm Abortions TAB SAB Ect Mult Living   5 3 3  1  1   3      Review of Systems  All other systems reviewed and are negative.     Allergies  Iodine  Home Medications   Prior to Admission medications   Medication Sig Start Date End Date Taking? Authorizing Provider  acetaminophen (TYLENOL) 500  MG tablet Take 1,000 mg by mouth 2 (two) times daily. 3 to 5 day regimen as needed for pain in place of Percocet   Yes Historical Provider, MD  ketorolac (TORADOL) 10 MG tablet Take 1 tablet (10 mg total) by mouth every 8 (eight) hours as needed. 03/01/14  Yes Florian Buff, MD  ondansetron (ZOFRAN) 8 MG tablet Take 1 tablet (8 mg total) by mouth every 8 (eight) hours as needed for nausea. 03/01/14  Yes Florian Buff, MD  oxyCODONE-acetaminophen (PERCOCET) 7.5-325 MG per tablet Take 1-2 tablets by mouth every 6 (six) hours as needed for pain. 03/01/14  Yes Florian Buff, MD  valACYclovir (VALTREX) 500 MG tablet Take 500 mg by mouth as directed.    Historical Provider, MD   BP 120/63  Pulse 87  Temp(Src) 98.3 F (36.8 C) (Oral)  Resp 18  Ht 5\' 3"  (1.6 m)  Wt 189 lb (85.73 kg)  BMI 33.49 kg/m2  SpO2 100%  LMP 02/14/2014  Breastfeeding? Unknown Physical Exam  Nursing note and vitals reviewed. Constitutional: She appears well-developed and well-nourished. No distress.  HENT:  Head: Normocephalic and atraumatic.  Right Ear: External ear normal.  Left Ear: External ear normal.  Eyes: Conjunctivae are normal. Right eye exhibits no discharge. Left eye exhibits no discharge. No scleral icterus.  Neck: Neck supple. No tracheal deviation  present.  Cardiovascular: Normal rate, regular rhythm and intact distal pulses.   Pulmonary/Chest: Effort normal and breath sounds normal. No stridor. No respiratory distress. She has no wheezes. She has no rales.  Abdominal: Soft. Bowel sounds are normal. She exhibits no distension. There is no tenderness. There is no rebound and no guarding.  3 surgical wounds with staple wound closure, no e/e   Musculoskeletal: She exhibits no edema and no tenderness.  Neurological: She is alert. She has normal strength. No cranial nerve deficit (no facial droop, extraocular movements intact, no slurred speech) or sensory deficit. She exhibits normal muscle tone. She displays no  seizure activity. Coordination normal.  Skin: Skin is warm and dry. No rash noted.  Psychiatric: She has a normal mood and affect.    ED Course  Procedures (including critical care time) Labs Review Labs Reviewed  CBC WITH DIFFERENTIAL - Abnormal; Notable for the following:    RBC 2.99 (*)    Hemoglobin 8.7 (*)    HCT 26.4 (*)    Monocytes Relative 13 (*)    All other components within normal limits  COMPREHENSIVE METABOLIC PANEL  LIPASE, BLOOD  URINALYSIS, ROUTINE W REFLEX MICROSCOPIC    Imaging Review Dg Abd Acute W/chest  03/07/2014   CLINICAL DATA:  Abdominal pain.  Surgery on 03/01/2014  EXAM: ACUTE ABDOMEN SERIES (ABDOMEN 2 VIEW & CHEST 1 VIEW)  COMPARISON:  None.  FINDINGS: There is no free intraperitoneal gas. No disproportionate dilatation of bowel. No air-fluid levels. Postoperative changes. Tiny right upper pole renal calculus is suspected.  Normal heart size.  Clear lungs.  No pneumothorax.  IMPRESSION: Nonobstructive bowel gas pattern. No active cardiopulmonary disease. Possible right nephrolithiasis.   Electronically Signed   By: Maryclare Bean M.D.   On: 03/07/2014 18:59      MDM   Final diagnoses:  Abdominal pain  Anemia    Patient's hemoglobin is decreased from her prior hemoglobin on May 27. However I reviewed the patient's op note and she had a ruptured ectopic pregnancy with approximately 500 cc of hemoperitoneum on May 27. I do feel patient's having any evidence to suggest active bleeding at this time. Her abdominal exam is benign.  I doubt acute infection. Her appetite is unchanged. She does not have an elevated white blood cell count.  X-rays did not show evidence of bowel obstruction. I suspect her symptoms may be related to her recovery from her recent surgery. Patient has plans to follow up with her OB/GYN doctor.  At this time there does not appear to be any evidence of an acute emergency medical condition and the patient appears stable for discharge with  appropriate outpatient follow up.    Dorie Rank, MD 03/07/14 2032

## 2014-03-07 NOTE — Discharge Instructions (Signed)
Abdominal Pain, Adult °Many things can cause abdominal pain. Usually, abdominal pain is not caused by a disease and will improve without treatment. It can often be observed and treated at home. Your health care provider will do a physical exam and possibly order blood tests and X-rays to help determine the seriousness of your pain. However, in many cases, more time must pass before a clear cause of the pain can be found. Before that point, your health care provider may not know if you need more testing or further treatment. °HOME CARE INSTRUCTIONS  °Monitor your abdominal pain for any changes. The following actions may help to alleviate any discomfort you are experiencing: °· Only take over-the-counter or prescription medicines as directed by your health care provider. °· Do not take laxatives unless directed to do so by your health care provider. °· Try a clear liquid diet (broth, tea, or water) as directed by your health care provider. Slowly move to a bland diet as tolerated. °SEEK MEDICAL CARE IF: °· You have unexplained abdominal pain. °· You have abdominal pain associated with nausea or diarrhea. °· You have pain when you urinate or have a bowel movement. °· You experience abdominal pain that wakes you in the night. °· You have abdominal pain that is worsened or improved by eating food. °· You have abdominal pain that is worsened with eating fatty foods. °SEEK IMMEDIATE MEDICAL CARE IF:  °· Your pain does not go away within 2 hours. °· You have a fever. °· You keep throwing up (vomiting). °· Your pain is felt only in portions of the abdomen, such as the right side or the left lower portion of the abdomen. °· You pass bloody or black tarry stools. °MAKE SURE YOU: °· Understand these instructions.   °· Will watch your condition.   °· Will get help right away if you are not doing well or get worse.   °Document Released: 07/02/2005 Document Revised: 07/13/2013 Document Reviewed: 06/01/2013 °[employer]® Patient  Information ©2014 [employer]. ° °

## 2014-03-07 NOTE — Telephone Encounter (Signed)
Pt states that she has a lot of gas, and pain on her right side. Pt wanted pain medication for tonight, has a follow up appointment tomorrow. I spoke with JAG about the pt's symptoms and she advised that the pt should go to the ED if if the pain and dis comfort is bad, if it's not then she needs to come in tomorrow. The Pt was advised of this and stated that "if it gets too bad I guess I will have to go" the pt verbalized understanding.

## 2014-03-08 ENCOUNTER — Ambulatory Visit (INDEPENDENT_AMBULATORY_CARE_PROVIDER_SITE_OTHER): Payer: Medicaid Other | Admitting: Obstetrics & Gynecology

## 2014-03-08 ENCOUNTER — Encounter: Payer: Self-pay | Admitting: Obstetrics & Gynecology

## 2014-03-08 VITALS — BP 110/60 | Wt 186.0 lb

## 2014-03-08 DIAGNOSIS — O009 Unspecified ectopic pregnancy without intrauterine pregnancy: Secondary | ICD-10-CM

## 2014-03-08 DIAGNOSIS — Z9889 Other specified postprocedural states: Secondary | ICD-10-CM

## 2014-03-08 NOTE — Progress Notes (Signed)
Patient ID: Sydney Mccall, female   DOB: 1985-10-31, 28 y.o.   MRN: [id number] 1 week post op visit from laparoscopic removal of ectopic pregnancy and bilateral salpingectomy Had 500 cc of hemoperiotneum ruptured in pre op at hospital  Incisions x 3  All look good staples removed   Routine post op course  Follow up prn  No burning with urination, frequency or urgency No nausea, vomiting or diarrhea Nor fever chills or other constitutional symptoms  Exam as above  Past Medical History  Diagnosis Date  . Genital herpes   . Pregnant   . Herpes genitalia   . Postpartum hemorrhage     history    Past Surgical History  Procedure Laterality Date  . Breast cyst excision    . Tubal ligation Bilateral 08/24/2013    Procedure: POST PARTUM TUBAL LIGATION;  Surgeon: Mora Bellman, MD;  Location: [facility];  Service: Gynecology;  Laterality: Bilateral;  . Diagnostic laparoscopy with removal of ectopic pregnancy Left 03/01/2014    Procedure: LAPAROSCOPIC REMOVAL OF LEFT ECTOPIC PREGNANCY;  Surgeon: Florian Buff, MD;  Location: [facility];  Service: Gynecology;  Laterality: Left;  . Laparoscopic bilateral salpingectomy Bilateral 03/01/2014    Procedure: LAPAROSCOPIC BILATERAL SALPINGECTOMY;  Surgeon: Florian Buff, MD;  Location: [facility];  Service: Gynecology;  Laterality: Bilateral;    OB History   Grav Para Term Preterm Abortions TAB SAB Ect Mult Living   5 3 3  1  1   3       Allergies  Allergen Reactions  . Iodine Anaphylaxis and Swelling    Throat itchy and swells     History   Social History  . Marital Status: Single    Spouse Name: N/A    Number of Children: N/A  . Years of Education: N/A   Social History Main Topics  . Smoking status: Never Smoker   . Smokeless tobacco: Never Used  . Alcohol Use: No  . Drug Use: No  . Sexual Activity: Yes    Birth Control/ Protection: None, Surgical     Comment: switched BC    Other Topics Concern  . None   Social History Narrative   . None    History reviewed. No pertinent family history.

## 2014-05-17 ENCOUNTER — Telehealth: Payer: Self-pay | Admitting: Family Medicine

## 2014-05-17 NOTE — Telephone Encounter (Signed)
Pt states she got this from [facility] at gyn but she is no longer seeing them. Would like to know if you would prescribe for her.

## 2014-05-17 NOTE — Telephone Encounter (Signed)
Patient said that her valACYclovir used to be prescribed 600 mg 2 times daily and now she is prescribed to take every 4 hours and she is not satisfied with it. Please advise. She would like to go back to her old dosage.   [facility].

## 2014-05-17 NOTE — Telephone Encounter (Signed)
We are willing to help her out but first we need to clarify why is the patient needing this medicine. If her goal is to prevent herpes reoccurrence or repetitive flareups then the recommendation is Valtrex 500 mg once daily every single day. If her goal is to treat just flareups when they occur then the prescription is different. Try to talk with the patient discussed this issue and clarify then send me further info. Thank you

## 2014-05-17 NOTE — Telephone Encounter (Signed)
Needs to take this up with her gyn who prescribed

## 2014-05-18 MED ORDER — VALACYCLOVIR HCL 500 MG PO TABS: 500.0000 mg | ORAL_TABLET | Freq: Every day | ORAL | Status: DC

## 2014-05-18 NOTE — Telephone Encounter (Signed)
Valtrex 500 mg, 1 daily, #30, 12 refills

## 2014-05-18 NOTE — Telephone Encounter (Signed)
Pt wants to take to prevent. She wants 500mg  one a day. Is this ok to send into [facility] and if so how may refills. Pt does not need a call back.

## 2014-05-18 NOTE — Telephone Encounter (Signed)
Med sent to pharm. Pt notified.  

## 2014-07-05 NOTE — Progress Notes (Signed)
This encounter was created in error - please disregard.

## 2014-08-07 ENCOUNTER — Encounter: Payer: Self-pay | Admitting: Obstetrics & Gynecology

## 2014-08-21 ENCOUNTER — Other Ambulatory Visit: Payer: Self-pay

## 2014-08-21 NOTE — Telephone Encounter (Signed)
Last seen 12/16/13.

## 2014-08-22 MED ORDER — METRONIDAZOLE 0.75 % VA GEL: 1.0000 | Freq: Two times a day (BID) | VAGINAL | Status: DC

## 2014-09-06 ENCOUNTER — Encounter: Payer: Self-pay | Admitting: Adult Health

## 2014-09-06 ENCOUNTER — Ambulatory Visit (INDEPENDENT_AMBULATORY_CARE_PROVIDER_SITE_OTHER): Payer: Medicaid Other | Admitting: Adult Health

## 2014-09-06 VITALS — BP 120/76 | HR 76 | Ht 63.0 in | Wt 169.0 lb

## 2014-09-06 DIAGNOSIS — B9689 Other specified bacterial agents as the cause of diseases classified elsewhere: Secondary | ICD-10-CM

## 2014-09-06 DIAGNOSIS — N898 Other specified noninflammatory disorders of vagina: Secondary | ICD-10-CM

## 2014-09-06 DIAGNOSIS — N760 Acute vaginitis: Secondary | ICD-10-CM

## 2014-09-06 DIAGNOSIS — L298 Other pruritus: Secondary | ICD-10-CM

## 2014-09-06 DIAGNOSIS — Z01419 Encounter for gynecological examination (general) (routine) without abnormal findings: Secondary | ICD-10-CM

## 2014-09-06 DIAGNOSIS — Z0000 Encounter for general adult medical examination without abnormal findings: Secondary | ICD-10-CM

## 2014-09-06 DIAGNOSIS — A499 Bacterial infection, unspecified: Secondary | ICD-10-CM

## 2014-09-06 HISTORY — DX: Other specified bacterial agents as the cause of diseases classified elsewhere: B96.89

## 2014-09-06 HISTORY — DX: Other specified noninflammatory disorders of vagina: N89.8

## 2014-09-06 HISTORY — DX: Other specified bacterial agents as the cause of diseases classified elsewhere: N76.0

## 2014-09-06 LAB — POCT WET PREP (WET MOUNT): WBC, Wet Prep HPF POC: POSITIVE

## 2014-09-06 MED ORDER — METRONIDAZOLE 0.75 % VA GEL: VAGINAL | Status: DC

## 2014-09-06 MED ORDER — FLUCONAZOLE 150 MG PO TABS
ORAL_TABLET | ORAL | Status: DC
Start: 2014-09-06 — End: 2014-09-14

## 2014-09-06 NOTE — Progress Notes (Signed)
Patient ID: Sydney Mccall, female   DOB: 1986-02-04, 28 y.o.   MRN: [id number] History of Present Illness:  Sydney Mccall is a 28 year old black female, single in for gyn exam.Complains of vaginal itch. Had normal pap 03/2013.  Current Medications, Allergies, Past Medical History, Past Surgical History, Family History and Social History were reviewed in [employer] record.     Review of Systems: Patient denies any headaches, blurred vision, shortness of breath, chest pain, abdominal pain, problems with bowel movements, urination, or intercourse. No joint pain or mood swings, see HPI.    Physical Exam:BP 120/76 mmHg  Pulse 76  Ht 5\' 3"  (1.6 m)  Wt 169 lb (76.658 kg)  BMI 29.94 kg/m2  LMP 08/11/2014  Breastfeeding? No General:  Well developed, well nourished, no acute distress Skin:  Warm and dry Neck:  Midline trachea, normal thyroid Lungs; Clear to auscultation bilaterally Breast:  No dominant palpable mass, retraction, or nipple discharge Cardiovascular: Regular rate and rhythm Abdomen:  Soft, non tender, no hepatosplenomegaly Pelvic:  External genitalia is normal in appearance.  The vagina has white discharge with slight odor, and red side walls.  The cervix is bulbous.  Uterus is felt to be normal size, shape, and contour.  No         adnexal masses or tenderness noted.Wet prep +WBC and + clue cells Extremities:  No swelling or varicosities noted Psych:  No mood changes,alert and cooperative,seems happy   Impression: Well woman gyn exam no pap Vaginal itch BV    Plan: Rx Metrogel use 1 applicator at hs x 5-7 nights Rx diflucan 150 mg #2 1 now and 1 in 3 days if needed 1 refill Return in 1 year for pap and physical Review handout on BV

## 2014-09-06 NOTE — Patient Instructions (Signed)
Bacterial Vaginosis Bacterial vaginosis is a vaginal infection that occurs when the normal balance of bacteria in the vagina is disrupted. It results from an overgrowth of certain bacteria. This is the most common vaginal infection in women of childbearing age. Treatment is important to prevent complications, especially in pregnant women, as it can cause a premature delivery. CAUSES  Bacterial vaginosis is caused by an increase in harmful bacteria that are normally present in smaller amounts in the vagina. Several different kinds of bacteria can cause bacterial vaginosis. However, the reason that the condition develops is not fully understood. RISK FACTORS Certain activities or behaviors can put you at an increased risk of developing bacterial vaginosis, including:  Having a new sex partner or multiple sex partners.  Douching.  Using an intrauterine device (IUD) for contraception. Women do not get bacterial vaginosis from toilet seats, bedding, swimming pools, or contact with objects around them. SIGNS AND SYMPTOMS  Some women with bacterial vaginosis have no signs or symptoms. Common symptoms include:  Grey vaginal discharge.  A fishlike odor with discharge, especially after sexual intercourse.  Itching or burning of the vagina and vulva.  Burning or pain with urination. DIAGNOSIS  Your health care provider will take a medical history and examine the vagina for signs of bacterial vaginosis. A sample of vaginal fluid may be taken. Your health care provider will look at this sample under a microscope to check for bacteria and abnormal cells. A vaginal pH test may also be done.  TREATMENT  Bacterial vaginosis may be treated with antibiotic medicines. These may be given in the form of a pill or a vaginal cream. A second round of antibiotics may be prescribed if the condition comes back after treatment.  HOME CARE INSTRUCTIONS   Only take over-the-counter or prescription medicines as  directed by your health care provider.  If antibiotic medicine was prescribed, take it as directed. Make sure you finish it even if you start to feel better.  Do not have sex until treatment is completed.  Tell all sexual partners that you have a vaginal infection. They should see their health care provider and be treated if they have problems, such as a mild rash or itching.  Practice safe sex by using condoms and only having one sex partner. SEEK MEDICAL CARE IF:   Your symptoms are not improving after 3 days of treatment.  You have increased discharge or pain.  You have a fever. MAKE SURE YOU:   Understand these instructions.  Will watch your condition.  Will get help right away if you are not doing well or get worse. FOR MORE INFORMATION  Centers for Disease Control and Prevention, Division of STD Prevention: [employer]raud[employer] American Sexual Health Association (ASHA): www.ashastd.org  Document Released: 09/22/2005 Document Revised: 07/13/2013 Document Reviewed: 05/04/2013 [facility] Patient Information 2015 [place], [place]. This information is not intended to replace advice given to you by your health care provider. Make sure you discuss any questions you have with your health care provider. Try metrogel no alcohol or sex Pap in 1 year

## 2014-09-14 ENCOUNTER — Encounter: Payer: Self-pay | Admitting: Advanced Practice Midwife

## 2014-09-14 ENCOUNTER — Telehealth: Payer: Self-pay | Admitting: Adult Health

## 2014-09-14 ENCOUNTER — Ambulatory Visit (INDEPENDENT_AMBULATORY_CARE_PROVIDER_SITE_OTHER): Payer: Medicaid Other | Admitting: Advanced Practice Midwife

## 2014-09-14 VITALS — BP 118/78 | Ht 63.0 in | Wt 170.0 lb

## 2014-09-14 DIAGNOSIS — R300 Dysuria: Secondary | ICD-10-CM

## 2014-09-14 MED ORDER — SULFAMETHOXAZOLE-TRIMETHOPRIM 800-160 MG PO TABS: 1.0000 | ORAL_TABLET | Freq: Two times a day (BID) | ORAL | Status: DC

## 2014-09-14 MED ORDER — PHENAZOPYRIDINE HCL 200 MG PO TABS: 200.0000 mg | ORAL_TABLET | Freq: Three times a day (TID) | ORAL | Status: DC | PRN

## 2014-09-14 MED ORDER — FLUCONAZOLE 150 MG PO TABS: ORAL_TABLET | ORAL | Status: DC

## 2014-09-14 NOTE — Progress Notes (Signed)
[facility] Visit  Patient name: Sydney Mccall MRN [id number]  Date of birth: 05-Jan-1986  CC & HPI:  Sydney Mccall is a 28 y.o. African American female presenting today for C/O dysuria and suprapubic pain for about 2 weeks.  Feels "uncomfortable' during intercourse  Pertinent History Reviewed:  Medical & Surgical Hx:   Past Medical History  Diagnosis Date  . Genital herpes   . Pregnant   . Herpes genitalia   . Postpartum hemorrhage     history  . Vaginal itching 09/06/2014  . BV (bacterial vaginosis) 09/06/2014   Past Surgical History  Procedure Laterality Date  . Breast cyst excision    . Tubal ligation Bilateral 08/24/2013    Procedure: POST PARTUM TUBAL LIGATION;  Surgeon: Mora Bellman, MD;  Location: [facility];  Service: Gynecology;  Laterality: Bilateral;  . Diagnostic laparoscopy with removal of ectopic pregnancy Left 03/01/2014    Procedure: LAPAROSCOPIC REMOVAL OF LEFT ECTOPIC PREGNANCY;  Surgeon: Florian Buff, MD;  Location: [facility];  Service: Gynecology;  Laterality: Left;  . Laparoscopic bilateral salpingectomy Bilateral 03/01/2014    Procedure: LAPAROSCOPIC BILATERAL SALPINGECTOMY;  Surgeon: Florian Buff, MD;  Location: [facility];  Service: Gynecology;  Laterality: Bilateral;   Medications: Reviewed & Updated - see associated section Social History: Reviewed -  reports that she has never smoked. She has never used smokeless tobacco.  Objective Findings:  Vitals: BP 118/78 mmHg  Ht 5\' 3"  (1.6 m)  Wt 170 lb (77.111 kg)  BMI 30.12 kg/m2  LMP 09/10/2014  Physical Examination: General appearance - alert, well appearing, and in no distress Mental status - alert, oriented to person, place, and time Abdomen:  Suprapubic tenderness Pelvic - normal external genitalia, vulva, vagina, cervix, uterus and adnexa  No results found for this or any previous visit (from the past 24 hour(s)).   Assessment & Plan:  A:   UTI vs Interstitial cystitis P:  Septra,  pyridium, culture urine.  Consider IC if culture negative   CRESENZO-DISHMAN,Aleiyah Halpin CNM 09/14/2014 4:02 PM

## 2014-09-14 NOTE — Patient Instructions (Signed)
Interstitial cystitis

## 2014-09-14 NOTE — Telephone Encounter (Signed)
Spoke with pt. Pt was wondering what the difference was between a UTI and a bladder infection. I advised it was the location. Pt is having pain with urination and cloudy urine. I advised she would need to be seen so we could check her urine. Pt voiced understanding.  Call transferred to front desk for appt. [place]

## 2014-09-16 LAB — URINE CULTURE

## 2014-11-13 ENCOUNTER — Other Ambulatory Visit: Payer: Self-pay | Admitting: *Deleted

## 2014-11-13 MED ORDER — AMOXICILLIN 500 MG PO CAPS: 500.0000 mg | ORAL_CAPSULE | Freq: Three times a day (TID) | ORAL | Status: DC
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
## 2015-04-10 ENCOUNTER — Encounter: Payer: Self-pay | Admitting: Family Medicine

## 2015-04-10 ENCOUNTER — Ambulatory Visit (INDEPENDENT_AMBULATORY_CARE_PROVIDER_SITE_OTHER): Payer: Medicaid Other | Admitting: Family Medicine

## 2015-04-10 VITALS — BP 122/78 | Temp 98.3°F | Ht 63.0 in | Wt 166.0 lb

## 2015-04-10 DIAGNOSIS — R21 Rash and other nonspecific skin eruption: Secondary | ICD-10-CM

## 2015-04-10 MED ORDER — FLUCONAZOLE 150 MG PO TABS: ORAL_TABLET | ORAL | Status: DC

## 2015-04-10 MED ORDER — DOXYCYCLINE HYCLATE 100 MG PO TABS: 100.0000 mg | ORAL_TABLET | Freq: Two times a day (BID) | ORAL | Status: DC

## 2015-04-10 NOTE — Progress Notes (Signed)
   Subjective:    Patient ID: Sydney Mccall, female    DOB: 1986/01/10, 29 y.o.   MRN: [id number]  HPIPainful red area on right lower leg. Came up 2 days ago. Has not tried any treatment.   Seen after-hours rather than begin since emergency room. Had a small bump that was like a bite a few days ago. Next  Now expanding enlarger. Tender and sore.    Review of Systems No fever no chills no vomiting no headache    Objective:   Physical Exam  Alert vitals stable lungs clear heart rare rhythm H&T normal right anterior leg erythematous papule tender with surrounding erythema and tender no fluctuance      Assessment & Plan:  Impression bite with secondary infection plan doxycycline 100 twice a day 7 days. Local measures discussed WSL

## 2015-05-10 ENCOUNTER — Other Ambulatory Visit: Payer: Self-pay | Admitting: Family Medicine

## 2015-06-12 ENCOUNTER — Ambulatory Visit (INDEPENDENT_AMBULATORY_CARE_PROVIDER_SITE_OTHER): Payer: Medicaid Other | Admitting: Women's Health

## 2015-06-12 ENCOUNTER — Ambulatory Visit: Payer: Medicaid Other | Admitting: Family Medicine

## 2015-06-12 ENCOUNTER — Encounter: Payer: Self-pay | Admitting: *Deleted

## 2015-06-12 ENCOUNTER — Encounter: Payer: Self-pay | Admitting: Women's Health

## 2015-06-12 VITALS — BP 134/66 | HR 72 | Wt 165.0 lb

## 2015-06-12 DIAGNOSIS — A499 Bacterial infection, unspecified: Secondary | ICD-10-CM | POA: Diagnosis not present

## 2015-06-12 DIAGNOSIS — N3289 Other specified disorders of bladder: Secondary | ICD-10-CM | POA: Diagnosis not present

## 2015-06-12 DIAGNOSIS — N390 Urinary tract infection, site not specified: Secondary | ICD-10-CM | POA: Diagnosis not present

## 2015-06-12 DIAGNOSIS — R102 Pelvic and perineal pain: Secondary | ICD-10-CM

## 2015-06-12 DIAGNOSIS — R350 Frequency of micturition: Secondary | ICD-10-CM

## 2015-06-12 DIAGNOSIS — N760 Acute vaginitis: Secondary | ICD-10-CM

## 2015-06-12 DIAGNOSIS — B9689 Other specified bacterial agents as the cause of diseases classified elsewhere: Secondary | ICD-10-CM

## 2015-06-12 LAB — POCT WET PREP (WET MOUNT): CLUE CELLS WET PREP WHIFF POC: POSITIVE

## 2015-06-12 MED ORDER — FLUCONAZOLE 150 MG PO TABS: 150.0000 mg | ORAL_TABLET | Freq: Once | ORAL | Status: DC

## 2015-06-12 MED ORDER — METRONIDAZOLE 0.75 % VA GEL: 1.0000 | Freq: Every day | VAGINAL | Status: DC

## 2015-06-12 MED ORDER — CIPROFLOXACIN HCL 500 MG PO TABS: 500.0000 mg | ORAL_TABLET | Freq: Two times a day (BID) | ORAL | Status: DC

## 2015-06-12 NOTE — Patient Instructions (Signed)
Pyridium (Phenazopyridine) 100mg  four times a day for bladder spasms

## 2015-06-12 NOTE — Progress Notes (Signed)
Patient ID: PEARLENA OW, female   DOB: July 26, 1986, 29 y.o.   MRN: [id number]   [facility] Visit  Patient name: Sydney Mccall MRN [id number]  Date of birth: 1986/09/07  CC & HPI:   Chief Complaint  Patient presents with  . Dysuria    pain with urination  X 1 wk and pain on lower rt side today   Sydney Mccall is a 29 y.o. [phone] African American female presenting today for report of urinary urgency, frequency, dysuria, and only dribbling when goes x 1 week, spasms at end of stream. RLQ pain and suprapubic pain. No fever/chills. Denies abnormal d/c, itching/odor/irritation.  Patient's last menstrual period was 06/08/2015. The current method of family planning is tubal ligation. Last pap 03/24/13, neg  Pertinent History Reviewed:  Medical & Surgical Hx:   Past Medical History  Diagnosis Date  . Genital herpes   . Pregnant   . Herpes genitalia   . Postpartum hemorrhage     history  . Vaginal itching 09/06/2014  . BV (bacterial vaginosis) 09/06/2014   Past Surgical History  Procedure Laterality Date  . Breast cyst excision    . Tubal ligation Bilateral 08/24/2013    Procedure: POST PARTUM TUBAL LIGATION;  Surgeon: Mora Bellman, MD;  Location: [facility];  Service: Gynecology;  Laterality: Bilateral;  . Diagnostic laparoscopy with removal of ectopic pregnancy Left 03/01/2014    Procedure: LAPAROSCOPIC REMOVAL OF LEFT ECTOPIC PREGNANCY;  Surgeon: Florian Buff, MD;  Location: [facility];  Service: Gynecology;  Laterality: Left;  . Laparoscopic bilateral salpingectomy Bilateral 03/01/2014    Procedure: LAPAROSCOPIC BILATERAL SALPINGECTOMY;  Surgeon: Florian Buff, MD;  Location: [facility];  Service: Gynecology;  Laterality: Bilateral;   Medications: Reviewed & Updated - see associated section Social History: Reviewed -  reports that she has never smoked. She has never used smokeless tobacco.  Objective Findings:  Vitals: BP 134/66 mmHg  Pulse 72  Wt 165 lb (74.844 kg)  LMP  06/08/2015 Body mass index is 29.24 kg/(m^2).  Physical Examination: General appearance - alert, well appearing, and in no distress Pelvic - normal external genitalia, vulva, vagina, cervix, uterus and adnexa On period. Malodorous d/c. Cx clear. No CMT, mild suprapubic tenderness and mild bilateral adnexae tenderness. No abnormalities/masses palpated  Urine: Lg blood, trace leuks  Results for orders placed or performed in visit on 06/12/15 (from the past 24 hour(s))  POCT Wet Prep [facility] [place])     Status: Abnormal   Collection Time: 06/12/15  2:52 PM  Result Value Ref Range   Source Wet Prep POC vaginal    WBC, Wet Prep HPF POC none    Bacteria Wet Prep HPF POC None None, Few   BACTERIA WET PREP MORPHOLOGY POC     Clue Cells Wet Prep HPF POC Many (A) None   Clue Cells Wet Prep Whiff POC Positive Whiff    Yeast Wet Prep HPF POC None    KOH Wet Prep POC     Trichomonas Wet Prep HPF POC none      Assessment & Plan:  A:   UTI w/ spasms  BV  P:  Urine for culture and gc/ct  Rx cipro bid x 3d for uti  Pyridium 100mg  qid otc for spams  Rx diflucan 150mg  po x 1, may repeat in 3d if needed per pt request- gets yeast infections w/ antbx  Eat yogurt  Rx metrogel qhs x 5 nights for bv, no sex or  etoh while taking  If pain doesn't improve let us know  Return for prn, june for physical.  Tawnya Crook CNM, [facility] 06/12/2015 2:51 PM

## 2015-06-13 LAB — GC/CHLAMYDIA PROBE AMP
CHLAMYDIA, DNA PROBE: NEGATIVE
Neisseria gonorrhoeae by PCR: NEGATIVE

## 2015-06-14 LAB — URINE CULTURE

## 2015-10-04 ENCOUNTER — Telehealth: Payer: Self-pay | Admitting: Family Medicine

## 2015-10-04 NOTE — Telephone Encounter (Signed)
Pt notified and transferred to front to schedule.

## 2015-10-04 NOTE — Telephone Encounter (Signed)
Pt is calling to say she refilled her Valtrex an since she started taking the refill  She has been feeling funny/different.   Her symptoms are vaginal related, does not feel like she has a yeast infection But it just feels different. Not sure it is even related to the med.  Does she need to be seen?

## 2015-10-04 NOTE — Telephone Encounter (Signed)
appt tomorrow with Sydney Mccall. [name]

## 2015-10-05 ENCOUNTER — Ambulatory Visit (INDEPENDENT_AMBULATORY_CARE_PROVIDER_SITE_OTHER): Payer: Medicaid Other | Admitting: Nurse Practitioner

## 2015-10-05 ENCOUNTER — Encounter: Payer: Self-pay | Admitting: Nurse Practitioner

## 2015-10-05 ENCOUNTER — Other Ambulatory Visit: Payer: Self-pay | Admitting: Family Medicine

## 2015-10-05 VITALS — BP 126/76 | Ht 63.0 in | Wt 167.0 lb

## 2015-10-05 DIAGNOSIS — N94819 Vulvodynia, unspecified: Secondary | ICD-10-CM

## 2015-10-05 NOTE — Telephone Encounter (Signed)
Patient wants to know if she can get this ASAP.  Please call when complete.

## 2015-10-08 ENCOUNTER — Encounter: Payer: Self-pay | Admitting: Nurse Practitioner

## 2015-10-08 NOTE — Progress Notes (Signed)
Subjective:  Presents for c/o mild discomfort in the external vaginal area. No lesions. No fever. No discharge or pelvic pain. Same sexual partner.   Objective:   BP 126/76 mmHg  Ht 5\' 3"  (1.6 m)  Wt 167 lb (75.751 kg)  BMI 29.59 kg/m2 External GU: no rashes or lesions. No erythema. No discharge. Minimal edema to external structures.   Assessment: Vulvodynia  Plan: defers any medication at this time since symptoms are very mild. Call back if worsens or persists.

## 2015-10-24 ENCOUNTER — Encounter: Payer: Self-pay | Admitting: Family Medicine

## 2015-10-24 ENCOUNTER — Ambulatory Visit (INDEPENDENT_AMBULATORY_CARE_PROVIDER_SITE_OTHER): Payer: Self-pay | Admitting: Family Medicine

## 2015-10-24 ENCOUNTER — Telehealth: Payer: Self-pay | Admitting: Family Medicine

## 2015-10-24 VITALS — BP 110/72 | Temp 98.5°F | Ht 63.0 in | Wt 167.0 lb

## 2015-10-24 DIAGNOSIS — R21 Rash and other nonspecific skin eruption: Secondary | ICD-10-CM

## 2015-10-24 MED ORDER — VALACYCLOVIR HCL 500 MG PO TABS: 500.0000 mg | ORAL_TABLET | Freq: Every day | ORAL | Status: DC

## 2015-10-24 MED ORDER — PREDNISONE 10 MG PO TABS: ORAL_TABLET | ORAL | Status: DC

## 2015-10-24 MED ORDER — TRIAMCINOLONE ACETONIDE 0.1 % EX CREA: 1.0000 "application " | TOPICAL_CREAM | Freq: Two times a day (BID) | CUTANEOUS | Status: DC

## 2015-10-24 NOTE — Telephone Encounter (Signed)
Rx sent electronically to pharmacy. Patient notified. 

## 2015-10-24 NOTE — Progress Notes (Signed)
   Subjective:    Patient ID: Sydney Mccall, female    DOB: 1986-04-02, 30 y.o.   MRN: [id number]  Rash This is a new problem. Episode onset:  4-5 days ago. Location: chest and neck. The rash is characterized by itchiness. She was exposed to nothing. Treatments tried: hydrocortizone.   fri sat started then, scratched   rash very pruritic in nature. No tenderness just plenty itching. Next  Wonders if she got bit by something. Next  Wonders if she caught an infection from client  Review of Systems  Skin: Positive for rash.    no headache no cough no sore throat    Objective:   Physical Exam   alert vitals stable lungs clear heart rare rhythm H&T patchy erythematous rash   irregular edges nontender positive pruritic      Assessment & Plan:   impression contact dermatitis discussed plan prednisone taper. Local 1 cream and Benadryl when necessary symptom care discussed WSL

## 2015-10-24 NOTE — Telephone Encounter (Signed)
valACYclovir (VALTREX) 500 MG tablet  Pt forgot to mention while she was here this morning that her  Script for this med went with out any refills. She was wondering if it Could be corrected an sent out with additional refills please?   [facility]

## 2015-10-24 NOTE — Telephone Encounter (Signed)
Ok six ref 

## 2015-12-22 IMAGING — CR DG ABDOMEN ACUTE W/ 1V CHEST
4 series · 4 of 4 positions shown · non-contrast
Comparison: None.

CLINICAL DATA: Abdominal pain.  Surgery on 03/01/2014

EXAM:
ACUTE ABDOMEN SERIES (ABDOMEN 2 VIEW & CHEST 1 VIEW)

[view not recorded (1 of 4)]
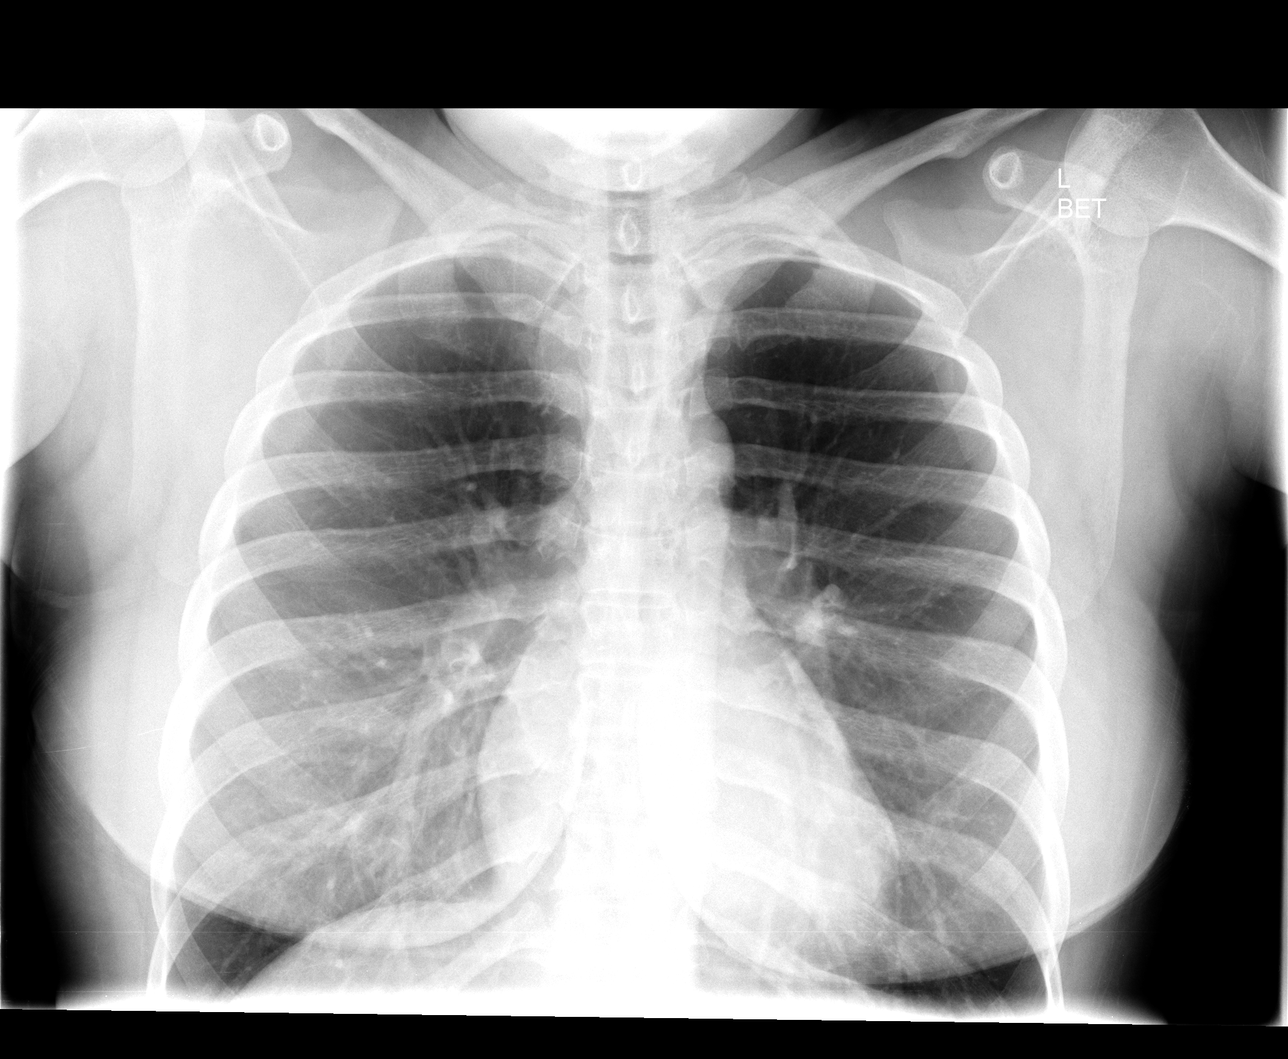

[view not recorded (2 of 4)]
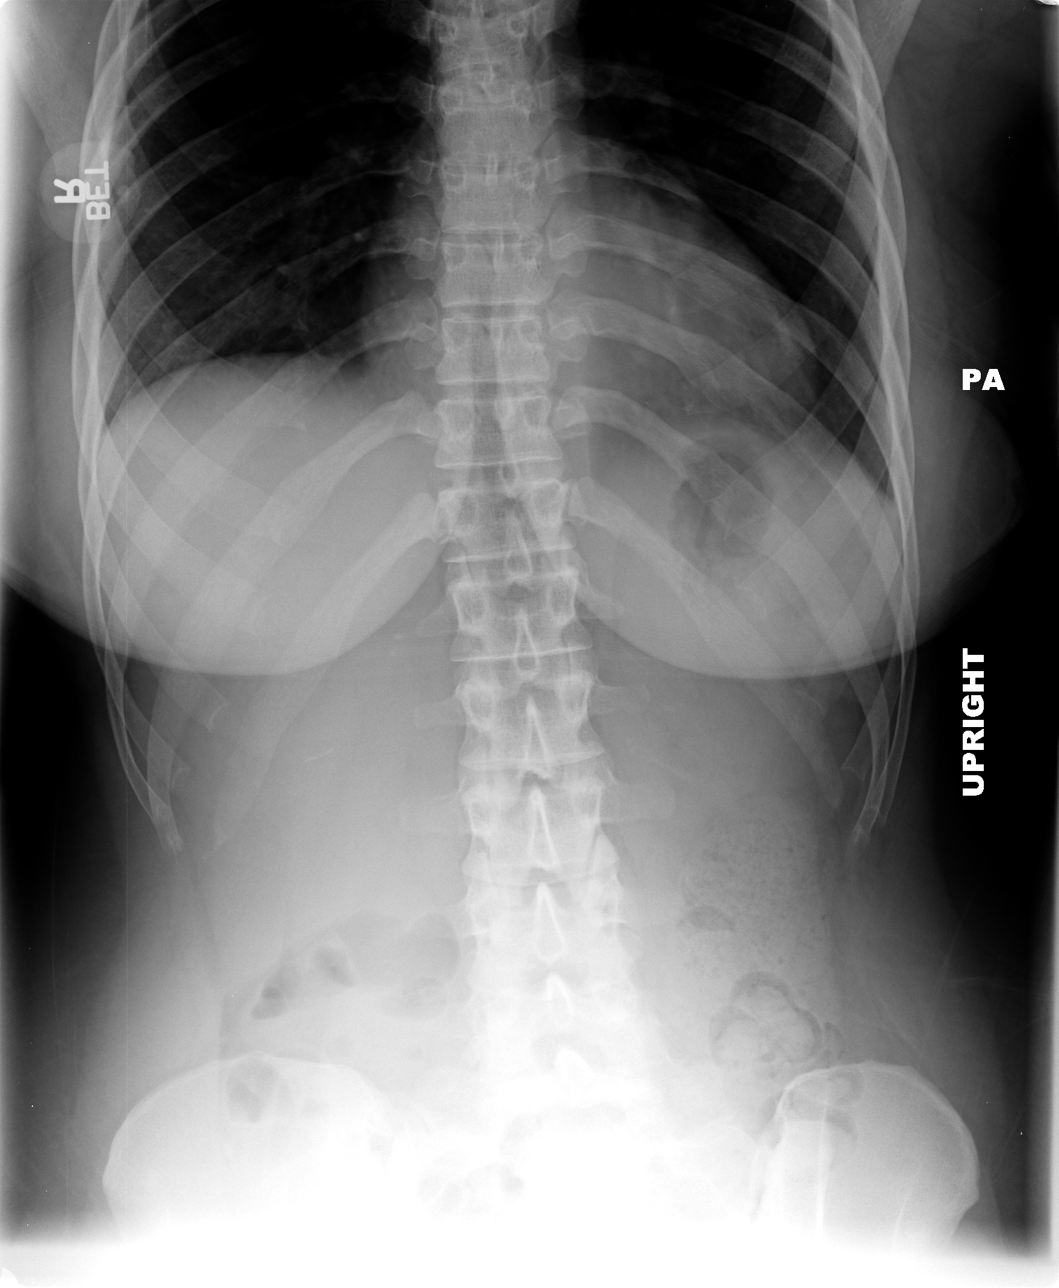

[view not recorded (3 of 4)]
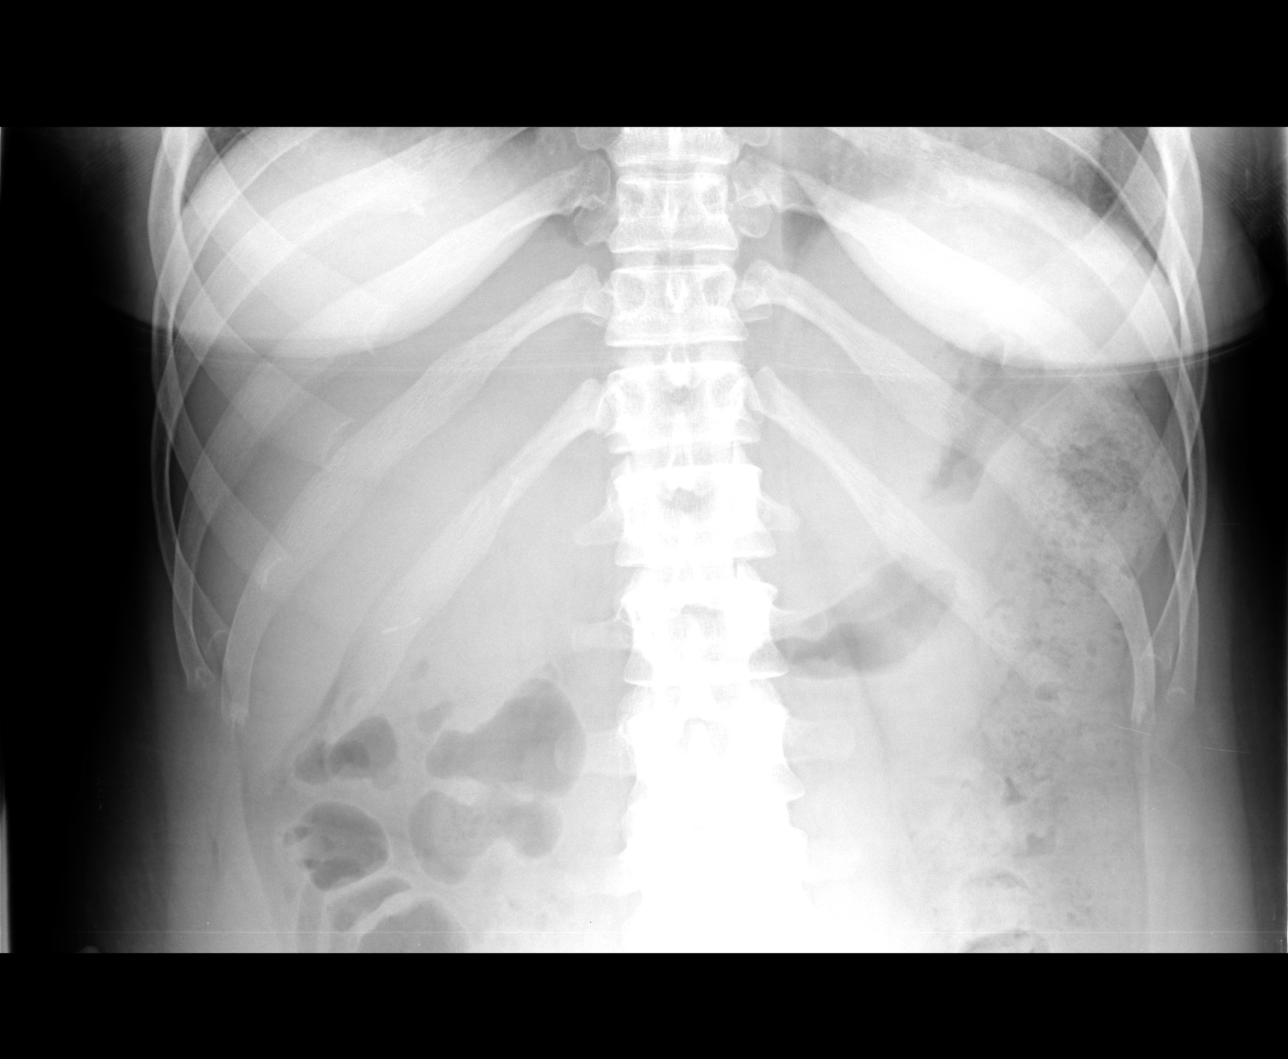

[view not recorded (4 of 4)]
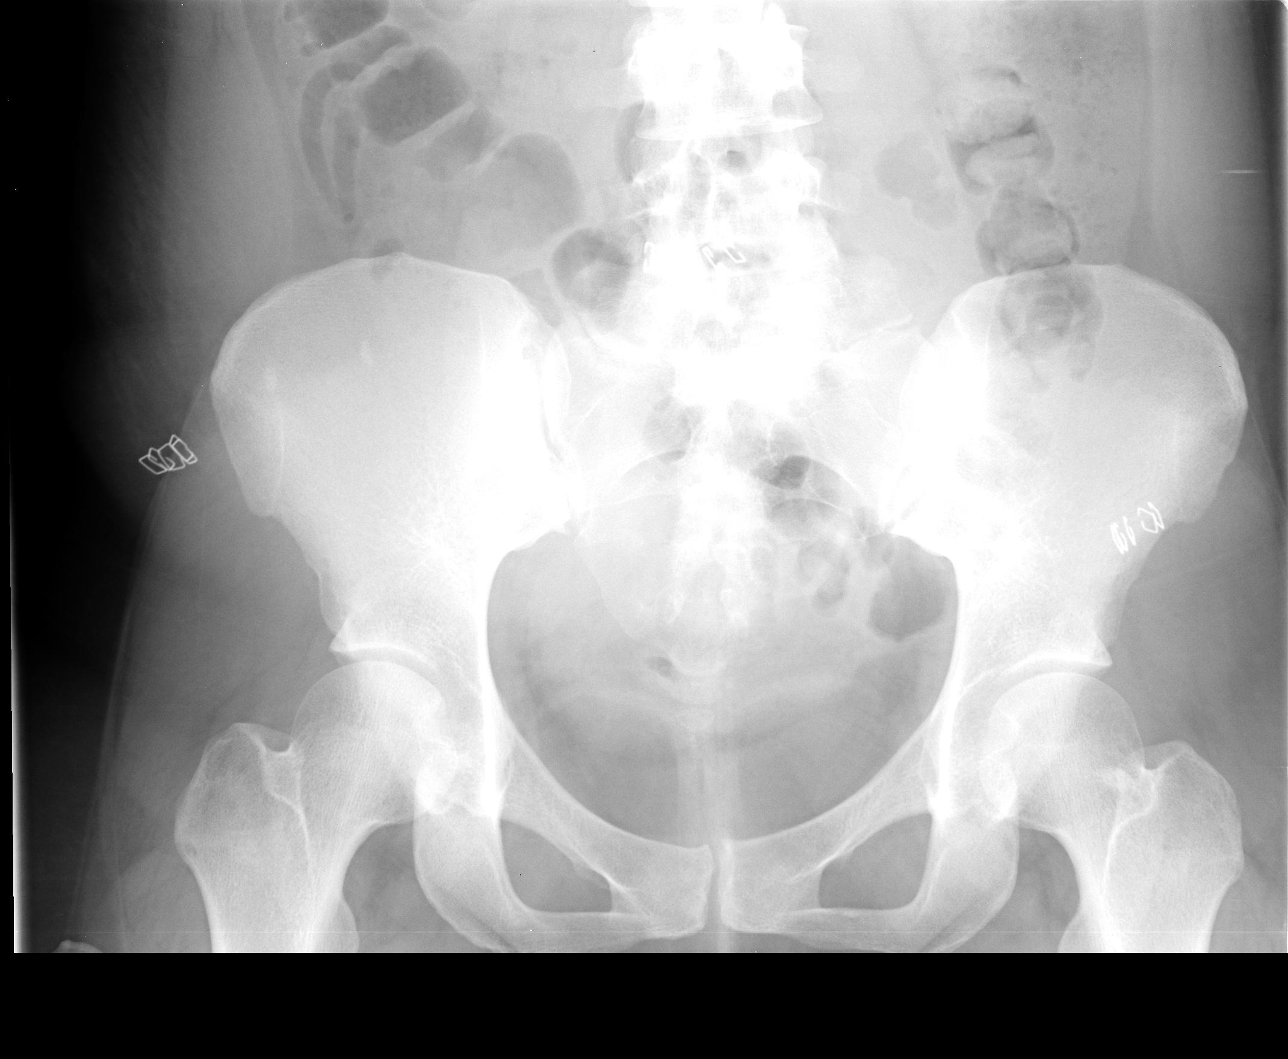

[4 of 4 positions shown; findings below may reference images not displayed]

FINDINGS: There is no free intraperitoneal gas. No disproportionate dilatation
of bowel. No air-fluid levels. Postoperative changes. Tiny right
upper pole renal calculus is suspected.

Normal heart size.  Clear lungs.  No pneumothorax.
IMPRESSION: Nonobstructive bowel gas pattern. No active cardiopulmonary disease.
Possible right nephrolithiasis.

## 2016-01-02 ENCOUNTER — Other Ambulatory Visit: Payer: Self-pay | Admitting: Women's Health

## 2016-01-09 ENCOUNTER — Other Ambulatory Visit: Payer: Self-pay | Admitting: Women's Health

## 2016-01-11 ENCOUNTER — Other Ambulatory Visit: Payer: Self-pay | Admitting: Women's Health

## 2016-02-15 ENCOUNTER — Telehealth: Payer: Self-pay | Admitting: Family Medicine

## 2016-02-15 NOTE — Telephone Encounter (Signed)
Error

## 2016-02-26 ENCOUNTER — Other Ambulatory Visit: Payer: Self-pay | Admitting: Family Medicine

## 2016-04-23 ENCOUNTER — Other Ambulatory Visit: Payer: Self-pay | Admitting: Family Medicine

## 2016-04-24 ENCOUNTER — Other Ambulatory Visit: Payer: Self-pay | Admitting: Family Medicine

## 2016-05-07 ENCOUNTER — Encounter: Payer: Self-pay | Admitting: Family Medicine

## 2016-05-07 ENCOUNTER — Ambulatory Visit (INDEPENDENT_AMBULATORY_CARE_PROVIDER_SITE_OTHER): Payer: Self-pay | Admitting: Family Medicine

## 2016-05-07 VITALS — BP 122/74 | Temp 97.9°F | Ht 63.0 in | Wt 179.0 lb

## 2016-05-07 DIAGNOSIS — D179 Benign lipomatous neoplasm, unspecified: Secondary | ICD-10-CM

## 2016-05-07 NOTE — Progress Notes (Signed)
   Subjective:    Patient ID: Sydney Mccall, female    DOB: Apr 26, 1986, 30 y.o.   MRN: [id number]  HPIknot on left side of back. noticied it one week ago. Not painful.  Made had it for long time not certain no shoulder pain no cough no chest discomfort    Review of Systems No headache, no major weight loss or weight gain, no chest pain no back pain abdominal pain no change in bowel habits complete ROS otherwise negative     Objective:   Physical Exam  Alert vitals stable, NAD. Blood pressure good on repeat. HEENT normal. Lungs clear. Heart regular rate and rhythm.       Assessment & Plan:  Impression lipoma left posterior shoulder soft freely movable not hard not nodular. High high likelihood lipoma. Patient does not have insurance and would prefer to avoid any unnecessary tests. In my opinion extremely high likelihood this is lipoma and costly workup not advisable for this patient WSL

## 2016-05-07 NOTE — Patient Instructions (Signed)
This is a lipoma 99.9 per cent likelihood, normal overgrowth of fat cells

## 2016-07-19 ENCOUNTER — Other Ambulatory Visit: Payer: Self-pay | Admitting: Nurse Practitioner

## 2016-07-21 ENCOUNTER — Telehealth: Payer: Self-pay | Admitting: Family Medicine

## 2016-07-21 NOTE — Telephone Encounter (Signed)
Tried to call no answer to get symptoms. Voicemail full

## 2016-07-21 NOTE — Telephone Encounter (Signed)
Requesting Rx for yeast infection.  [place]

## 2016-08-01 ENCOUNTER — Inpatient Hospital Stay (HOSPITAL_COMMUNITY)
Admission: AD | Admit: 2016-08-01 | Discharge: 2016-08-01 | Disposition: A | Discharge status: Home / Self Care | Payer: Medicaid Other | Source: Ambulatory Visit | Attending: Obstetrics and Gynecology | Admitting: Obstetrics and Gynecology

## 2016-08-01 ENCOUNTER — Encounter (HOSPITAL_COMMUNITY): Payer: Self-pay | Admitting: *Deleted

## 2016-08-01 DIAGNOSIS — Z3490 Encounter for supervision of normal pregnancy, unspecified, unspecified trimester: Secondary | ICD-10-CM | POA: Insufficient documentation

## 2016-09-11 ENCOUNTER — Other Ambulatory Visit: Payer: Self-pay | Admitting: Family Medicine

## 2016-09-16 ENCOUNTER — Other Ambulatory Visit: Payer: Self-pay | Admitting: Nurse Practitioner

## 2017-02-17 ENCOUNTER — Other Ambulatory Visit: Payer: Self-pay | Admitting: Nurse Practitioner

## 2017-03-10 ENCOUNTER — Encounter: Payer: Self-pay | Admitting: Family Medicine

## 2017-04-01 ENCOUNTER — Encounter: Payer: Self-pay | Admitting: Adult Health

## 2017-04-01 ENCOUNTER — Ambulatory Visit (INDEPENDENT_AMBULATORY_CARE_PROVIDER_SITE_OTHER): Payer: Medicaid Other | Admitting: Adult Health

## 2017-04-01 ENCOUNTER — Other Ambulatory Visit (HOSPITAL_COMMUNITY)
Admission: RE | Admit: 2017-04-01 | Discharge: 2017-04-01 | Disposition: A | Discharge status: Home / Self Care | Payer: Medicaid Other | Source: Ambulatory Visit | Attending: Adult Health | Admitting: Adult Health

## 2017-04-01 VITALS — BP 130/70 | HR 68 | Ht 63.0 in | Wt 190.6 lb

## 2017-04-01 DIAGNOSIS — Z01419 Encounter for gynecological examination (general) (routine) without abnormal findings: Secondary | ICD-10-CM | POA: Insufficient documentation

## 2017-04-01 DIAGNOSIS — Z308 Encounter for other contraceptive management: Secondary | ICD-10-CM | POA: Diagnosis not present

## 2017-04-01 DIAGNOSIS — Z3009 Encounter for other general counseling and advice on contraception: Secondary | ICD-10-CM

## 2017-04-01 NOTE — Progress Notes (Signed)
Patient ID: Sydney Mccall, female   DOB: 06/23/1986, 31 y.o.   MRN: [id number] History of Present Illness:  Sydney Mccall is a 31 year old black female in for well woman gyn exam and pap.lack female in for well woman gyn exam and pap.   Current Medications, Allergies, Past Medical History, Past Surgical History, Family History and Social History were reviewed in [employer] record.     Review of Systems: Patient denies any headaches, hearing loss, fatigue, blurred vision, shortness of breath, chest pain, abdominal pain, problems with bowel movements, urination, or intercourse. No joint pain or mood swings.Waking up in middle of night    Physical Exam:BP 130/70 (BP Location: Right Arm, Patient Position: Sitting, Cuff Size: Normal)   Pulse 68   Ht 5\' 3"  (1.6 m)   Wt 190 lb 9.6 oz (86.5 kg)   LMP 03/21/2017   Breastfeeding? Unknown   BMI 33.76 kg/m  General:  Well developed, well nourished, no acute distress Skin:  Warm and dry Neck:  Midline trachea, normal thyroid, good ROM, no lymphadenopathy Lungs; Clear to auscultation bilaterally Breast:  No dominant palpable mass, retraction, or nipple discharge Cardiovascular: Regular rate and rhythm Abdomen:  Soft, non tender, no hepatosplenomegaly Pelvic:  External genitalia is normal in appearance, no lesions.  The vagina is normal in appearance. Urethra has no lesions or masses. The cervix is bulbous. Pap with HPV and GC/CHL performed. Uterus is felt to be normal size, shape, and contour.  No adnexal masses or tenderness noted.Bladder is non tender, no masses felt. Extremities/musculoskeletal:  No swelling or varicosities noted, no clubbing or cyanosis Psych:  No mood changes, alert and cooperative,seems happy PHQ 2 score 0.  Impression: 1. Encounter for gynecological examination with Papanicolaou smear of cervix   2. Family planning       Plan: Check HIV and RPR Physical in 1 year,pap in 3 if normal Try melatonin or even benadryl,if wake sup move to another  bed or couch Try eating whole fruits and veggies with lean meat, and less sugary foods

## 2017-04-02 ENCOUNTER — Telehealth: Payer: Self-pay | Admitting: Family Medicine

## 2017-04-02 LAB — HIV ANTIBODY (ROUTINE TESTING W REFLEX): HIV SCREEN 4TH GENERATION: NONREACTIVE

## 2017-04-02 LAB — RPR: RPR Ser Ql: NONREACTIVE

## 2017-04-02 MED ORDER — SULFACETAMIDE SODIUM 10 % OP SOLN: OPHTHALMIC | 0 refills | Status: DC

## 2017-04-02 NOTE — Telephone Encounter (Signed)
Spoke with patient and patient stated that she is experiencing mucus drainage, itching, pain and swelling to eye. Per standing orders Sulfacetamide eye drops sent into pharmacy. Patient verbalized understanding.

## 2017-04-02 NOTE — Telephone Encounter (Signed)
Pt is needing something called in for conjunctivitis.    [facility]

## 2017-04-03 LAB — CYTOLOGY - PAP
CHLAMYDIA, DNA PROBE: NEGATIVE
DIAGNOSIS: NEGATIVE
HPV: NOT DETECTED
NEISSERIA GONORRHEA: NEGATIVE

## 2017-07-07 ENCOUNTER — Telehealth: Payer: Self-pay | Admitting: *Deleted

## 2017-07-07 MED ORDER — VALACYCLOVIR HCL 500 MG PO TABS: 500.0000 mg | ORAL_TABLET | Freq: Every day | ORAL | 12 refills | Status: DC

## 2017-07-07 NOTE — Telephone Encounter (Signed)
Pt needs refill on valtrex and had question about tubal in 2015.Refill done and questions answered.

## 2017-10-13 ENCOUNTER — Other Ambulatory Visit: Payer: Self-pay | Admitting: Adult Health

## 2017-11-25 ENCOUNTER — Telehealth: Payer: Self-pay | Admitting: Adult Health

## 2017-11-25 NOTE — Telephone Encounter (Signed)
Has some itching in throat, ?allergy Having hard BMs,has noticed blood, too,  make appt.

## 2017-11-25 NOTE — Telephone Encounter (Signed)
Patient stated she wanted to only speak with Anderson [place]. Please call.

## 2017-12-25 ENCOUNTER — Other Ambulatory Visit: Payer: Self-pay | Admitting: Adult Health

## 2018-04-05 ENCOUNTER — Encounter: Payer: Self-pay | Admitting: Adult Health

## 2018-04-05 ENCOUNTER — Encounter (INDEPENDENT_AMBULATORY_CARE_PROVIDER_SITE_OTHER): Payer: Self-pay

## 2018-04-05 ENCOUNTER — Ambulatory Visit (INDEPENDENT_AMBULATORY_CARE_PROVIDER_SITE_OTHER): Payer: 59 | Admitting: Adult Health

## 2018-04-05 VITALS — BP 124/65 | HR 92 | Ht 63.0 in | Wt 202.0 lb

## 2018-04-05 DIAGNOSIS — Z113 Encounter for screening for infections with a predominantly sexual mode of transmission: Secondary | ICD-10-CM

## 2018-04-05 DIAGNOSIS — Z01411 Encounter for gynecological examination (general) (routine) with abnormal findings: Secondary | ICD-10-CM | POA: Diagnosis not present

## 2018-04-05 DIAGNOSIS — Z3009 Encounter for other general counseling and advice on contraception: Secondary | ICD-10-CM | POA: Insufficient documentation

## 2018-04-05 DIAGNOSIS — L255 Unspecified contact dermatitis due to plants, except food: Secondary | ICD-10-CM

## 2018-04-05 DIAGNOSIS — Z01419 Encounter for gynecological examination (general) (routine) without abnormal findings: Secondary | ICD-10-CM

## 2018-04-05 NOTE — Progress Notes (Signed)
Patient ID: Sydney Mccall, female   DOB: [birth year], 32 y.o.   MRN: [id number] History of Present Illness:  Sydney Mccall is a 32 year old black female in for a well woman gyn exam,she had normal pap with negative HPV 04/01/17.She works in Water engineer at [employer].   Current Medications, Allergies, Past Medical History, Past Surgical History, Family History and Social History were reviewed in [employer] record.     Review of Systems:  Patient denies any headaches, hearing loss, fatigue, blurred vision, shortness of breath, chest pain, abdominal pain, problems with bowel movements, urination, or intercourse. No joint pain or mood swings.   Physical Exam:BP 124/65 (BP Location: Left Arm, Patient Position: Sitting, Cuff Size: Small)   Pulse 92   Ht 5\' 3"  (1.6 m)   Wt 202 lb (91.6 kg)   LMP 03/10/2018   BMI 35.78 kg/m  General:  Well developed, well nourished, no acute distress Skin:  Warm and dry Neck:  Midline trachea, normal thyroid, good ROM, no lymphadenopathy Lungs; Clear to auscultation bilaterally Breast:  No dominant palpable mass, retraction, or nipple discharge Cardiovascular: Regular rate and rhythm Abdomen:  Soft, non tender, no hepatosplenomegaly Pelvic:  External genitalia is normal in appearance, no lesions.  The vagina is normal in appearance. Urethra has no lesions or masses. The cervix is bulbous.  Uterus is felt to be normal size, shape, and contour.  No adnexal masses or tenderness noted.Bladder is non tender, no masses felt.GC/CHL obtained. Extremities/musculoskeletal:  No swelling or varicosities noted, no clubbing or cyanosis,has area of contact dermatitis left arm,pulled weeds recently.  Psych:  No mood changes, alert and cooperative,seems happy PHQ 9 score 1.  Impression: 1. Encounter for well woman exam with routine gynecological exam   2. Family planning   3. Screening examination for STD (sexually transmitted disease)   4.  Contact dermatitis due to plant       Plan: GC/CHL sent Check for HIV and RPR Physical in 1 year Pap in 2021 Mammogram at 39 Colonoscopy at 49 Fasting labs next year  Can use steroid cream on left arm

## 2018-04-06 LAB — HIV ANTIBODY (ROUTINE TESTING W REFLEX): HIV SCREEN 4TH GENERATION: NONREACTIVE

## 2018-04-06 LAB — RPR: RPR Ser Ql: NONREACTIVE

## 2018-04-07 LAB — GC/CHLAMYDIA PROBE AMP
CHLAMYDIA, DNA PROBE: NEGATIVE
NEISSERIA GONORRHOEAE BY PCR: NEGATIVE

## 2018-06-12 ENCOUNTER — Encounter (HOSPITAL_COMMUNITY): Payer: Self-pay | Admitting: *Deleted

## 2018-06-12 ENCOUNTER — Other Ambulatory Visit: Payer: Self-pay

## 2018-06-12 DIAGNOSIS — Z79899 Other long term (current) drug therapy: Secondary | ICD-10-CM | POA: Diagnosis not present

## 2018-06-12 DIAGNOSIS — S46901A Unspecified injury of unspecified muscle, fascia and tendon at shoulder and upper arm level, right arm, initial encounter: Secondary | ICD-10-CM | POA: Diagnosis not present

## 2018-06-12 DIAGNOSIS — Y929 Unspecified place or not applicable: Secondary | ICD-10-CM | POA: Diagnosis not present

## 2018-06-12 DIAGNOSIS — Y999 Unspecified external cause status: Secondary | ICD-10-CM | POA: Insufficient documentation

## 2018-06-12 DIAGNOSIS — S4992XA Unspecified injury of left shoulder and upper arm, initial encounter: Secondary | ICD-10-CM | POA: Diagnosis present

## 2018-06-12 DIAGNOSIS — Y939 Activity, unspecified: Secondary | ICD-10-CM | POA: Diagnosis not present

## 2018-06-12 DIAGNOSIS — S46912A Strain of unspecified muscle, fascia and tendon at shoulder and upper arm level, left arm, initial encounter: Secondary | ICD-10-CM | POA: Diagnosis not present
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
## 2018-06-13 ENCOUNTER — Emergency Department (HOSPITAL_COMMUNITY)
Admission: EM | Admit: 2018-06-13 | Discharge: 2018-06-13 | Disposition: A | Discharge status: Home / Self Care | Payer: No Typology Code available for payment source | Attending: Emergency Medicine | Admitting: Emergency Medicine

## 2018-06-13 DIAGNOSIS — T148XXA Other injury of unspecified body region, initial encounter: Secondary | ICD-10-CM

## 2018-06-13 MED ORDER — CYCLOBENZAPRINE HCL 10 MG PO TABS
10.0000 mg | ORAL_TABLET | Freq: Once | ORAL | Status: AC
  Administered 2018-06-13: 10 mg via ORAL
  Filled 2018-06-13: qty 1

## 2018-06-13 MED ORDER — CYCLOBENZAPRINE HCL 10 MG PO TABS: 10.0000 mg | ORAL_TABLET | Freq: Two times a day (BID) | ORAL | 0 refills | Status: DC | PRN
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
## 2018-07-30 ENCOUNTER — Other Ambulatory Visit: Payer: Self-pay | Admitting: Adult Health

## 2018-09-11 ENCOUNTER — Other Ambulatory Visit: Payer: Self-pay | Admitting: Adult Health

## 2019-06-01 ENCOUNTER — Other Ambulatory Visit: Payer: Self-pay | Admitting: Adult Health

## 2019-06-01 ENCOUNTER — Telehealth: Payer: Self-pay | Admitting: Medical

## 2019-06-01 NOTE — Telephone Encounter (Signed)

## 2019-06-02 ENCOUNTER — Encounter: Payer: Self-pay | Admitting: Medical

## 2019-06-02 ENCOUNTER — Other Ambulatory Visit: Payer: Self-pay

## 2019-06-02 ENCOUNTER — Ambulatory Visit (INDEPENDENT_AMBULATORY_CARE_PROVIDER_SITE_OTHER): Payer: Medicaid Other | Admitting: Medical

## 2019-06-02 VITALS — BP 126/68 | HR 83 | Ht 63.0 in | Wt 197.5 lb

## 2019-06-02 DIAGNOSIS — F419 Anxiety disorder, unspecified: Secondary | ICD-10-CM

## 2019-06-02 DIAGNOSIS — Z0000 Encounter for general adult medical examination without abnormal findings: Secondary | ICD-10-CM | POA: Diagnosis not present

## 2019-06-02 DIAGNOSIS — Z3009 Encounter for other general counseling and advice on contraception: Secondary | ICD-10-CM

## 2019-06-02 MED ORDER — ALPRAZOLAM 0.25 MG PO TABS: 0.2500 mg | ORAL_TABLET | Freq: Two times a day (BID) | ORAL | 0 refills | Status: DC | PRN

## 2019-06-02 NOTE — Patient Instructions (Signed)

## 2019-06-02 NOTE — Progress Notes (Signed)
  Subjective:    Sydney Mccall is a 33 y.o. female who presents for an annual exam. The patient has no GYN complaints today. The patient is sexually active. She is not currently on birth control. She is having regular periods. GYN screening history: last pap: approximate date 04/2017 and was normal. The patient has noted anxiety recently, particularly when having dental work done. She denies any history of anxiety.   Menstrual History: OB History    Gravida  7   Para  3   Term  3   Preterm      AB  2   Living  3     SAB  1   TAB      Ectopic  1   Multiple      Live Births  3           Patient's last menstrual period was 05/15/2019.    The following portions of the patient's history were reviewed and updated as appropriate: allergies, current medications, past family history, past medical history, past social history, past surgical history and problem list.  Review of Systems Pertinent items are noted in HPI.    Objective:   Physical Exam  Vitals reviewed. Constitutional: She is oriented to person, place, and time. She appears well-developed and well-nourished. No distress.  HENT:  Head: Normocephalic and atraumatic.  Eyes: EOM are normal.  Neck: Normal range of motion. Neck supple. No thyromegaly present.  Cardiovascular: Normal rate, regular rhythm and normal heart sounds.  No murmur heard. Respiratory: Effort normal and breath sounds normal. No respiratory distress. She has no wheezes. Right breast exhibits no inverted nipple, no mass, no nipple discharge, no skin change and no tenderness. Left breast exhibits no inverted nipple, no mass, no nipple discharge, no skin change and no tenderness. Breasts are symmetrical.  GI: Soft. Bowel sounds are normal. She exhibits no distension and no mass. There is no abdominal tenderness. There is no rebound and no guarding.  Genitourinary: Uterus is not enlarged and not tender. Cervix exhibits no motion tenderness, no  discharge and no friability. Right adnexum displays no mass and no tenderness. Left adnexum displays no mass and no tenderness.    No vaginal discharge or bleeding.  No bleeding in the vagina.  Neurological: She is alert and oriented to person, place, and time.  Skin: Skin is warm and dry. No erythema.  Psychiatric: She has a normal mood and affect.     Assessment:    Healthy female exam.   Anxiety    Plan:     All questions answered.   Rx for Xanax sent to patient's pharmacy  GC/Chlamydia, HIV and RPR today  Patient will be contacted with any abnormal results  Patient to follow-up with [facility] in 3 months for anxiety and to determine the effectiveness of the medication for her needs  Danielle Rankin 06/02/2019 4:16 PM

## 2019-06-03 LAB — HIV ANTIBODY (ROUTINE TESTING W REFLEX): HIV Screen 4th Generation wRfx: NONREACTIVE

## 2019-06-03 LAB — RPR: RPR Ser Ql: NONREACTIVE

## 2019-06-06 LAB — GC/CHLAMYDIA PROBE AMP
Chlamydia trachomatis, NAA: NEGATIVE
Neisseria Gonorrhoeae by PCR: NEGATIVE

## 2019-07-25 ENCOUNTER — Other Ambulatory Visit: Payer: Self-pay | Admitting: Adult Health

## 2019-08-10 ENCOUNTER — Encounter: Payer: Self-pay | Admitting: Obstetrics and Gynecology

## 2019-08-10 ENCOUNTER — Ambulatory Visit (INDEPENDENT_AMBULATORY_CARE_PROVIDER_SITE_OTHER): Payer: Self-pay | Admitting: Obstetrics and Gynecology

## 2019-08-10 ENCOUNTER — Other Ambulatory Visit: Payer: Self-pay

## 2019-08-10 ENCOUNTER — Other Ambulatory Visit (HOSPITAL_COMMUNITY)
Admission: RE | Admit: 2019-08-10 | Discharge: 2019-08-10 | Disposition: A | Discharge status: Home / Self Care | Payer: Medicaid Other | Source: Ambulatory Visit | Attending: Obstetrics and Gynecology | Admitting: Obstetrics and Gynecology

## 2019-08-10 VITALS — BP 125/68 | HR 81 | Ht 63.0 in | Wt 196.8 lb

## 2019-08-10 DIAGNOSIS — N898 Other specified noninflammatory disorders of vagina: Secondary | ICD-10-CM | POA: Diagnosis not present

## 2019-08-10 NOTE — Progress Notes (Signed)
Sydney Mccall presents with c/o external vaginal itching for the last several days. Thought it might be a yeast and took a Diflucan on Monday. Noted no difference. Tried Monistat externally last night ans has had buring since. Sexual active with same partner. No problems. Did change laundry detergents recently.  Last STD test in Aug, negative  PE AF VSS Lungs clear Heart RRR Abd soft + BS GU Nl EGBUS, some external labial perineal body redness, scant vaginal discharge ? Monistat cream , cultures obtained  A/P Vaginal itching Culture obtained. Consider different laundry detergent. A & D onit bid to tid. Additional treatment as per test results. F/U PRN

## 2019-08-12 LAB — CERVICOVAGINAL ANCILLARY ONLY
Bacterial Vaginitis (gardnerella): POSITIVE — AB
Candida Glabrata: NEGATIVE
Candida Vaginitis: POSITIVE — AB
Chlamydia: NEGATIVE
Comment: NEGATIVE
Comment: NEGATIVE
Comment: NEGATIVE
Comment: NEGATIVE
Comment: NEGATIVE
Comment: NORMAL
Neisseria Gonorrhea: NEGATIVE
Trichomonas: NEGATIVE

## 2019-08-15 ENCOUNTER — Telehealth: Payer: Self-pay | Admitting: *Deleted

## 2019-08-15 MED ORDER — FLUCONAZOLE 150 MG PO TABS: 150.0000 mg | ORAL_TABLET | Freq: Once | ORAL | 0 refills | Status: DC

## 2019-08-15 MED ORDER — METRONIDAZOLE 500 MG PO TABS: 500.0000 mg | ORAL_TABLET | Freq: Two times a day (BID) | ORAL | 0 refills | Status: DC

## 2019-08-15 NOTE — Telephone Encounter (Signed)
Pt informed of results and that rx was at pharmacy.

## 2019-08-15 NOTE — Telephone Encounter (Signed)
-----   Message from Chancy Milroy, MD sent at 08/15/2019  8:25 AM EST ----- Flagyl 500 mg po bid x days for BV Diflucan 150 mg po now and repeat in 3 days for yeast Thanks Legrand Como

## 2019-08-15 NOTE — Telephone Encounter (Signed)
Patient left message that she had 3 missed calls from our office.

## 2019-08-15 NOTE — Telephone Encounter (Signed)
Called to inform of swab results. Unable to leave VM, not set-up. Meds sent in to [facility] for BV and yeast.

## 2019-08-29 ENCOUNTER — Ambulatory Visit: Payer: Medicaid Other | Admitting: Women's Health

## 2020-01-10 ENCOUNTER — Telehealth: Payer: Self-pay | Admitting: Adult Health

## 2020-01-10 NOTE — Telephone Encounter (Signed)

## 2020-01-11 ENCOUNTER — Encounter: Payer: Self-pay | Admitting: Adult Health

## 2020-01-11 ENCOUNTER — Other Ambulatory Visit: Payer: Self-pay

## 2020-01-11 ENCOUNTER — Ambulatory Visit (INDEPENDENT_AMBULATORY_CARE_PROVIDER_SITE_OTHER): Payer: Self-pay | Admitting: Adult Health

## 2020-01-11 VITALS — BP 126/67 | HR 86 | Ht 63.0 in | Wt 208.0 lb

## 2020-01-11 DIAGNOSIS — K5900 Constipation, unspecified: Secondary | ICD-10-CM

## 2020-01-11 DIAGNOSIS — Z6836 Body mass index (BMI) 36.0-36.9, adult: Secondary | ICD-10-CM | POA: Insufficient documentation

## 2020-01-11 DIAGNOSIS — Z713 Dietary counseling and surveillance: Secondary | ICD-10-CM

## 2020-01-11 MED ORDER — PHENTERMINE HCL 37.5 MG PO TABS: 37.5000 mg | ORAL_TABLET | Freq: Every day | ORAL | 0 refills | Status: DC

## 2020-01-11 NOTE — Progress Notes (Signed)
  Subjective:     Patient ID: Sydney Mccall, female   DOB: 03/08/1986, 34 y.o.   MRN: [id number]  HPI Sydney Mccall is a 34 year old black female,single, [id number], in to discuss weight loss meds, has gained some weight.   Review of Systems +weight gain, wants to lose some and needs help +constipation, does not go daily and stool  is hard and sees blood at times in stool, not on paper or in toilet  Reviewed past medical,surgical, social and family history. Reviewed medications and allergies.     Objective:   Physical Exam BP 126/67 (BP Location: Left Arm, Patient Position: Sitting, Cuff Size: Normal)   Pulse 86   Ht 5\' 3"  (1.6 m)   Wt 208 lb (94.3 kg)   LMP 01/07/2020   BMI 36.85 kg/m  Skin warm and dry. Neck: mid line trachea, normal thyroid, good ROM, no lymphadenopathy noted. Lungs: clear to ausculation bilaterally. Cardiovascular: regular rate and rhythm.    Assessment:     1. Weight loss counseling, encounter for Increase water Decrease carbs  Decrease portion size Will rx adipex Meds ordered this encounter  Medications  . phentermine (ADIPEX-P) 37.5 MG tablet    Sig: Take 1 tablet (37.5 mg total) by mouth daily before breakfast.    Dispense:  30 tablet    Refill:  0    Order Specific Question:   Supervising Provider    Answer:   Tania Ade H [[id number]]  Discussed to expect to lose 6-8 lbs a month   2. Body mass index 36.0-36.9, adult  3. Constipation, unspecified constipation type Try Senna S Try preparation H    Plan:     Follow up in 4 weeks for weight and BP  If still sees blood in BM will do rectal exam

## 2020-02-07 ENCOUNTER — Telehealth: Payer: Self-pay | Admitting: Adult Health

## 2020-02-07 NOTE — Telephone Encounter (Signed)

## 2020-02-08 ENCOUNTER — Ambulatory Visit (INDEPENDENT_AMBULATORY_CARE_PROVIDER_SITE_OTHER): Payer: Self-pay | Admitting: Adult Health

## 2020-02-08 ENCOUNTER — Other Ambulatory Visit: Payer: Self-pay

## 2020-02-08 ENCOUNTER — Encounter: Payer: Self-pay | Admitting: Adult Health

## 2020-02-08 VITALS — BP 127/70 | HR 85 | Ht 63.0 in | Wt 196.0 lb

## 2020-02-08 DIAGNOSIS — Z713 Dietary counseling and surveillance: Secondary | ICD-10-CM

## 2020-02-08 DIAGNOSIS — Z6834 Body mass index (BMI) 34.0-34.9, adult: Secondary | ICD-10-CM

## 2020-02-08 MED ORDER — PHENTERMINE HCL 37.5 MG PO CAPS: 37.5000 mg | ORAL_CAPSULE | ORAL | 0 refills | Status: DC

## 2020-02-08 NOTE — Progress Notes (Signed)
  Subjective:     Patient ID: Sydney Mccall, female   DOB: [birth year], 34 y.o.   MRN: [id number]  HPI Sydney Mccall is a 34 year old black female, single,[id number], in for a weight and BP check has been on adipex for 1 month, is drinking more water and eating more greens.  Review of Systems Denies any headaches, trouble sleeping or chest pain Has lost weight Has no constipation or blood in stool since changing diet Reviewed past medical,surgical, social and family history. Reviewed medications and allergies.     Objective:   Physical Exam BP 127/70 (BP Location: Right Arm, Patient Position: Sitting, Cuff Size: Normal)   Pulse 85   Ht 5\' 3"  (1.6 m)   Wt 196 lb (88.9 kg)   LMP 02/03/2020 (Exact Date)   BMI 34.72 kg/m  Skin warm and dry. Lungs: clear to ausculation bilaterally. Cardiovascular: regular rate and rhythm. Has lost 12 lbs.    Assessment:     1. Weight loss counseling, encounter for Continue water and greens Will refill adipex,she wants capsule this time  Meds ordered this encounter  Medications  . phentermine 37.5 MG capsule    Sig: Take 1 capsule (37.5 mg total) by mouth every morning.    Dispense:  30 capsule    Refill:  0    Order Specific Question:   Supervising Provider    Answer:   Elonda Husky, LUTHER H [[id number]]    2. Body mass index 34.0-34.9, adult    Plan:     Follow up in 4 weeks

## 2020-02-27 ENCOUNTER — Other Ambulatory Visit: Payer: Self-pay | Admitting: Obstetrics and Gynecology

## 2020-03-06 ENCOUNTER — Telehealth: Payer: Self-pay | Admitting: Adult Health

## 2020-03-06 NOTE — Telephone Encounter (Signed)

## 2020-03-07 ENCOUNTER — Ambulatory Visit (INDEPENDENT_AMBULATORY_CARE_PROVIDER_SITE_OTHER): Payer: Self-pay | Admitting: Adult Health

## 2020-03-07 ENCOUNTER — Encounter: Payer: Self-pay | Admitting: Adult Health

## 2020-03-07 VITALS — BP 130/78 | HR 82 | Ht 63.0 in | Wt 190.2 lb

## 2020-03-07 DIAGNOSIS — Z713 Dietary counseling and surveillance: Secondary | ICD-10-CM

## 2020-03-07 DIAGNOSIS — Z6833 Body mass index (BMI) 33.0-33.9, adult: Secondary | ICD-10-CM

## 2020-03-07 MED ORDER — PHENTERMINE HCL 37.5 MG PO CAPS: 37.5000 mg | ORAL_CAPSULE | ORAL | 0 refills | Status: DC

## 2020-03-07 NOTE — Progress Notes (Signed)
  Subjective:     Patient ID: Sydney Mccall, female   DOB: 14-Jul-1986, 34 y.o.   MRN: [id number]  HPI Sydney Mccall is a 34 year old black female, single [id number] in for weight check, has ben on adipex for 2 months.   Review of Systems Has lost more weight Feels good, denies any headaches or trouble sleeping  Reviewed past medical,surgical, social and family history. Reviewed medications and allergies.     Objective:   Physical Exam BP 130/78 (BP Location: Left Arm, Patient Position: Sitting, Cuff Size: Normal)   Pulse 82   Ht 5\' 3"  (1.6 m)   Wt 190 lb 3.2 oz (86.3 kg)   BMI 33.69 kg/m  Skin warm and dry.Lungs: clear to ausculation bilaterally. Cardiovascular: regular rate and rhythm.   has lost 6 lbs more for total of 18. Assessment:     1. Weight loss counseling, encounter for Has lost 18 lbs, continue weight loss efforts, will refill adipex, will then try on her on Meds ordered this encounter  Medications  . phentermine 37.5 MG capsule    Sig: Take 1 capsule (37.5 mg total) by mouth every morning.    Dispense:  30 capsule    Refill:  0    Order Specific Question:   Supervising Provider    Answer:   Elonda Husky, LUTHER H [[id number]]    2. Body mass index 33.0-33.9, adult     Plan:     Follow up prn Keep me posted on continued results

## 2020-03-09 ENCOUNTER — Telehealth: Payer: Self-pay | Admitting: Adult Health

## 2020-03-09 NOTE — Telephone Encounter (Signed)
Called pharmacy back, patient prefers tablets. Jonni Sanger wanted to see if it was ok to change it. I gave verbal to change to tablets.

## 2020-03-09 NOTE — Telephone Encounter (Signed)
Please call [place] in Ref to her phentermine

## 2020-05-08 ENCOUNTER — Telehealth: Payer: Self-pay | Admitting: Adult Health

## 2020-05-08 MED ORDER — METRONIDAZOLE 0.75 % VA GEL: 1.0000 | Freq: Every day | VAGINAL | 0 refills | Status: DC

## 2020-05-08 NOTE — Telephone Encounter (Signed)
Will rx metrogel  

## 2020-05-08 NOTE — Addendum Note (Signed)
Addended by: Derrek Monaco A on: 05/08/2020 01:43 PM   Modules accepted: Orders

## 2020-05-08 NOTE — Telephone Encounter (Signed)
Pt needs a prescription refill. However pt is unable to recall name of medicine.

## 2020-05-08 NOTE — Telephone Encounter (Signed)
Telephoned patient at home number. Patient states would like metrogel called into pharmacy. Patient states had yoni steam yesterday and now feels PH is off. Patient is not having any vaginal discharge or odor. Advised patient would need appointment but would like medication called in. Advised would send to provider and to check with pharmacy later. Patient voiced understanding.

## 2020-05-17 ENCOUNTER — Ambulatory Visit: Payer: Medicaid Other | Admitting: Adult Health

## 2020-08-01 ENCOUNTER — Other Ambulatory Visit: Payer: Medicaid Other | Admitting: Women's Health

## 2020-08-13 ENCOUNTER — Other Ambulatory Visit (HOSPITAL_COMMUNITY)
Admission: RE | Admit: 2020-08-13 | Discharge: 2020-08-13 | Disposition: A | Discharge status: Home / Self Care | Payer: 59 | Source: Ambulatory Visit | Attending: Obstetrics & Gynecology | Admitting: Obstetrics & Gynecology

## 2020-08-13 ENCOUNTER — Other Ambulatory Visit: Payer: Self-pay | Admitting: Women's Health

## 2020-08-13 ENCOUNTER — Encounter: Payer: Self-pay | Admitting: Women's Health

## 2020-08-13 ENCOUNTER — Ambulatory Visit (INDEPENDENT_AMBULATORY_CARE_PROVIDER_SITE_OTHER): Payer: 59 | Admitting: Women's Health

## 2020-08-13 VITALS — BP 124/70 | HR 88 | Ht 63.0 in | Wt 187.2 lb

## 2020-08-13 DIAGNOSIS — Z1321 Encounter for screening for nutritional disorder: Secondary | ICD-10-CM

## 2020-08-13 DIAGNOSIS — Z124 Encounter for screening for malignant neoplasm of cervix: Secondary | ICD-10-CM

## 2020-08-13 DIAGNOSIS — Z1329 Encounter for screening for other suspected endocrine disorder: Secondary | ICD-10-CM

## 2020-08-13 DIAGNOSIS — R6889 Other general symptoms and signs: Secondary | ICD-10-CM | POA: Diagnosis not present

## 2020-08-13 DIAGNOSIS — Z01419 Encounter for gynecological examination (general) (routine) without abnormal findings: Secondary | ICD-10-CM

## 2020-08-13 DIAGNOSIS — Z131 Encounter for screening for diabetes mellitus: Secondary | ICD-10-CM | POA: Diagnosis not present

## 2020-08-13 DIAGNOSIS — R202 Paresthesia of skin: Secondary | ICD-10-CM | POA: Diagnosis not present

## 2020-08-13 DIAGNOSIS — Z13220 Encounter for screening for lipoid disorders: Secondary | ICD-10-CM

## 2020-08-13 NOTE — Patient Instructions (Signed)
[facility] is open Monday-Friday 8am to 5pm and closed daily from 12:30pm-1:30pm for lunch.

## 2020-08-13 NOTE — Progress Notes (Signed)
WELL-WOMAN EXAMINATION Patient name: Sydney Mccall MRN [id number]  Date of birth: 1986/01/22 Chief Complaint:   Gynecologic Exam  History of Present Illness:   Sydney Mccall is a 33 y.o. [phone] African American female being seen today for a routine well-woman exam.  Current complaints: thinks she needs to get her head checked out. Has just felt different for about a year. Feels changes in left lower base of head. Forgetful. Left eye was open wider the other day, fingertips numb/tingling. Denies facial drooping, unilateral weakness, etc.    Depression screen [facility] 2/9 08/13/2020 06/02/2019 04/05/2018 04/01/2017  Decreased Interest 0 0 0 0  Down, Depressed, Hopeless 0 0 0 0  PHQ - 2 Score 0 0 0 0  Altered sleeping 0 - 1 -  Tired, decreased energy 0 - 0 -  Change in appetite 0 - 0 -  Feeling bad or failure about yourself  0 - 0 -  Trouble concentrating 0 - 0 -  Moving slowly or fidgety/restless 0 - 0 -  Suicidal thoughts 0 - 0 -  PHQ-9 Score 0 - 1 -  Difficult doing work/chores - - Not difficult at all -     PCP: none      does desire labs Patient's last menstrual period was 07/25/2020 (exact date). The current method of family planning is bilateral salpingectomy.  Last pap 04/01/17. Results were: normal. H/O abnormal pap: no Last mammogram: never. Results were: N/A. Family h/o breast cancer: no Last colonoscopy: never. Results were: N/A. Family h/o colorectal cancer: no Review of Systems:   Pertinent items are noted in HPI Denies any headaches, blurred vision, fatigue, shortness of breath, chest pain, abdominal pain, abnormal vaginal discharge/itching/odor/irritation, problems with periods, bowel movements, urination, or intercourse unless otherwise stated above. Pertinent History Reviewed:  Reviewed past medical,surgical, social and family history.  Reviewed problem list, medications and allergies. Physical Assessment:   Vitals:   08/13/20 1445  BP: 124/70  Pulse: 88    Weight: 187 lb 3.2 oz (84.9 kg)  Height: 5\' 3"  (1.6 m)  Body mass index is 33.16 kg/m.        Physical Examination:   General appearance - well appearing, and in no distress  Mental status - alert, oriented to person, place, and time  Psych:  She has a normal mood and affect  Skin - warm and dry, normal color, no suspicious lesions noted  Chest - effort normal, all lung fields clear to auscultation bilaterally  Heart - normal rate and regular rhythm  Neck:  midline trachea, no thyromegaly or nodules  Breasts - breasts appear normal, no suspicious masses, no skin or nipple changes or  axillary nodes  Abdomen - soft, nontender, nondistended, no masses or organomegaly  Pelvic - VULVA: normal appearing vulva with no masses, tenderness or lesions  VAGINA: normal appearing vagina with normal color and discharge, no lesions  CERVIX: normal appearing cervix without discharge or lesions, no CMT  Thin prep pap is done w/ HR HPV cotesting  UTERUS: uterus is felt to be normal size, shape, consistency and nontender   ADNEXA: No adnexal masses or tenderness noted.  Extremities:  No swelling or varicosities noted  Chaperone: Angel Neas    No results found for this or any previous visit (from the past 24 hour(s)).  Assessment & Plan:  1) Well-Woman Exam  2) Forgetfulness, paresthesias, Lt eye open wider than Rt the other day, feels like something is different in Lt lower base of  skull> will get brain MRI, order placed, routed to Tish to schedule  Labs/procedures today: pap, labs  Mammogram @35yo  or sooner if problems Colonoscopy @35yo  or sooner if problems  No orders of the defined types were placed in this encounter.   Meds: No orders of the defined types were placed in this encounter.   Follow-up: next Wed (when she if off) for fasting labs, then 60yr physical  [place], [facility] 08/13/2020 2:59 PM

## 2020-08-15 LAB — CYTOLOGY - PAP
Comment: NEGATIVE
Diagnosis: NEGATIVE
High risk HPV: NEGATIVE

## 2020-08-18 ENCOUNTER — Ambulatory Visit (HOSPITAL_COMMUNITY): Admission: RE | Admit: 2020-08-18 | Payer: 59 | Source: Ambulatory Visit

## 2020-10-18 ENCOUNTER — Other Ambulatory Visit: Payer: Self-pay | Admitting: Adult Health

## 2020-12-27 ENCOUNTER — Ambulatory Visit (INDEPENDENT_AMBULATORY_CARE_PROVIDER_SITE_OTHER): Payer: 59 | Admitting: Adult Health

## 2020-12-27 ENCOUNTER — Encounter: Payer: Self-pay | Admitting: Adult Health

## 2020-12-27 ENCOUNTER — Other Ambulatory Visit: Payer: Self-pay

## 2020-12-27 VITALS — BP 127/70 | HR 101 | Ht 63.0 in | Wt 201.4 lb

## 2020-12-27 DIAGNOSIS — Z6835 Body mass index (BMI) 35.0-35.9, adult: Secondary | ICD-10-CM | POA: Insufficient documentation

## 2020-12-27 DIAGNOSIS — Z713 Dietary counseling and surveillance: Secondary | ICD-10-CM

## 2020-12-27 MED ORDER — FLUCONAZOLE 150 MG PO TABS: ORAL_TABLET | ORAL | 2 refills | Status: DC

## 2020-12-27 MED ORDER — PHENTERMINE HCL 37.5 MG PO TABS: 37.5000 mg | ORAL_TABLET | Freq: Every day | ORAL | 0 refills | Status: DC

## 2020-12-27 NOTE — Progress Notes (Signed)
  Subjective:     Patient ID: Sydney Mccall, female   DOB: [birth year], 35 y.o.   MRN: [id number]  HPI Sydney Mccall is a 35 year old black female,single, [id number] in requesting to phentermine, has gained weight and wants refill on diflucan. 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   Review of Systems Has gained weight Patient denies any headaches, hearing loss, fatigue, blurred vision, shortness of breath, chest pain,  problems with bowel movements, urination, or intercourse. No joint pain or mood swings. Stomach hurts at night some.   Reviewed past medical,surgical, social and family history. Reviewed medications and allergies.  Objective:   Physical Exam BP 127/70 (BP Location: Right Arm, Patient Position: Sitting, Cuff Size: Normal)   Pulse (!) 101   Ht 5\' 3"  (1.6 m)   Wt 201 lb 6.4 oz (91.4 kg)   LMP 12/12/2020   BMI 35.68 kg/m Skin warm and dry. Lungs: clear to ausculation bilaterally. Cardiovascular: regular rate and rhythm.   Fall risk is low PHQ 2 score is 0  Upstream - 12/27/20 1621      Pregnancy Intention Screening   Does the patient want to become pregnant in the next year? No    Does the patient's partner want to become pregnant in the next year? No    Would the patient like to discuss contraceptive options today? No      Contraception Wrap Up   Current Method Female Sterilization    End Method Female Sterilization    Contraception Counseling Provided No          Assessment:     1. Weight loss counseling, encounter for Will rx adipex Meds ordered this encounter  Medications  . phentermine (ADIPEX-P) 37.5 MG tablet    Sig: Take 1 tablet (37.5 mg total) by mouth daily before breakfast.    Dispense:  30 tablet    Refill:  0    Order Specific Question:   Supervising Provider    Answer:   Elonda Husky, LUTHER H [[id number]]  . fluconazole (DIFLUCAN) 150 MG tablet    Sig: TAKE ONE TABLET BY MOUTH 3 DAYS APART    Dispense:  2 tablet    Refill:  2    Order  Specific Question:   Supervising Provider    Answer:   Elonda Husky, LUTHER H [[id number]]    2. Body mass index 35.0-35.9, adult     Plan:    Eat smaller meals   Increase activity Follow up in 4 weeks

## 2021-01-24 ENCOUNTER — Other Ambulatory Visit: Payer: Self-pay

## 2021-01-24 ENCOUNTER — Encounter: Payer: Self-pay | Admitting: Adult Health

## 2021-01-24 ENCOUNTER — Ambulatory Visit (INDEPENDENT_AMBULATORY_CARE_PROVIDER_SITE_OTHER): Payer: 59 | Admitting: Adult Health

## 2021-01-24 VITALS — BP 130/69 | HR 113 | Ht 63.0 in | Wt 193.0 lb

## 2021-01-24 DIAGNOSIS — Z713 Dietary counseling and surveillance: Secondary | ICD-10-CM | POA: Diagnosis not present

## 2021-01-24 DIAGNOSIS — Z6834 Body mass index (BMI) 34.0-34.9, adult: Secondary | ICD-10-CM

## 2021-01-24 MED ORDER — PHENTERMINE HCL 37.5 MG PO TABS: 37.5000 mg | ORAL_TABLET | Freq: Every day | ORAL | 0 refills | Status: DC

## 2021-01-24 NOTE — Progress Notes (Signed)
  Subjective:     Patient ID: Sydney Mccall, female   DOB: September 24, 1986, 35 y.o.   MRN: [id number]  HPI Sydney Mccall is a 35 year old black female,single, [id number] in for weight and BP check, after starting adipex.   Review of Systems   +weight loss  Reviewed past medical,surgical, social and family history. Reviewed medications and allergies.  Objective:   Physical Exam BP 130/69 (BP Location: Right Arm, Patient Position: Sitting, Cuff Size: Normal)   Pulse (!) 113   Ht 5\' 3"  (1.6 m)   Wt 193 lb (87.5 kg)   BMI 34.19 kg/m  Skin warm and dry. Lungs: clear to ausculation bilaterally. Cardiovascular: regular rate and rhythm.   She has lost 8.5 lbs  Upstream - 01/24/21 1222      Pregnancy Intention Screening   Does the patient want to become pregnant in the next year? No    Does the patient's partner want to become pregnant in the next year? No    Would the patient like to discuss contraceptive options today? No      Contraception Wrap Up   Current Method Female Sterilization    End Method Female Sterilization    Contraception Counseling Provided No          Assessment:     1. Weight loss counseling, encounter for Continue weight loss efforts Will refill adipex Meds ordered this encounter  Medications  . phentermine (ADIPEX-P) 37.5 MG tablet    Sig: Take 1 tablet (37.5 mg total) by mouth daily before breakfast.    Dispense:  30 tablet    Refill:  0    Order Specific Question:   Supervising Provider    Answer:   Elonda Husky, LUTHER H [[id number]]    2. Body mass index 34.0-34.9, adult     Plan:     Follow up in 4 weeks

## 2021-02-20 ENCOUNTER — Ambulatory Visit: Payer: 59 | Admitting: Adult Health

## 2021-03-01 ENCOUNTER — Ambulatory Visit: Payer: 59 | Admitting: Adult Health

## 2021-04-03 ENCOUNTER — Other Ambulatory Visit: Payer: Self-pay

## 2021-04-03 ENCOUNTER — Ambulatory Visit (INDEPENDENT_AMBULATORY_CARE_PROVIDER_SITE_OTHER): Payer: 59 | Admitting: Adult Health

## 2021-04-03 ENCOUNTER — Encounter: Payer: Self-pay | Admitting: Adult Health

## 2021-04-03 VITALS — BP 131/76 | HR 92 | Ht 63.0 in | Wt 185.0 lb

## 2021-04-03 DIAGNOSIS — Z713 Dietary counseling and surveillance: Secondary | ICD-10-CM

## 2021-04-03 DIAGNOSIS — Z6832 Body mass index (BMI) 32.0-32.9, adult: Secondary | ICD-10-CM | POA: Diagnosis not present

## 2021-04-03 MED ORDER — PHENTERMINE HCL 37.5 MG PO TABS: 37.5000 mg | ORAL_TABLET | Freq: Every day | ORAL | 0 refills | Status: DC

## 2021-04-03 NOTE — Progress Notes (Signed)
  Subjective:     Patient ID: Sydney Mccall, female   DOB: [birth year], 35 y.o.   MRN: [id number]  HPI Sydney Mccall is a 35 year old black female,single, [id number] in for weight and BP check, missed May appt.   Review of Systems Has pain both legs near back of knee at times +weight loss  Reviewed past medical,surgical, social and family history. Reviewed medications and allergies.     Objective:   Physical Exam BP 131/76 (BP Location: Left Arm, Patient Position: Sitting, Cuff Size: Normal)   Pulse 92   Ht 5\' 3"  (1.6 m)   Wt 185 lb (83.9 kg)   LMP 03/07/2021 (Exact Date)   BMI 32.77 kg/m  Skin warm and dry.Lungs: clear to ausculation bilaterally. Cardiovascular: regular rate and rhythm.  No masses or temperature changes in back of legs near knee   Upstream - 04/03/21 1631       Pregnancy Intention Screening   Does the patient want to become pregnant in the next year? No    Does the patient's partner want to become pregnant in the next year? No    Would the patient like to discuss contraceptive options today? No      Contraception Wrap Up   Current Method Female Sterilization    End Method Female Sterilization    Contraception Counseling Provided No            Has lost 16.5 lbs since end of March     Assessment:     1. Weight loss counseling, encounter for Will refill adipex Meds ordered this encounter  Medications   phentermine (ADIPEX-P) 37.5 MG tablet    Sig: Take 1 tablet (37.5 mg total) by mouth daily before breakfast.    Dispense:  30 tablet    Refill:  0    Order Specific Question:   Supervising Provider    Answer:   Tania Ade H [[id number]]   Continue intermittent fasting Try to walk 30 minutes ever yday  2. Body mass index 32.0-32.9, adult     Plan:     Follow up in 4 weeks

## 2021-05-01 ENCOUNTER — Ambulatory Visit: Payer: 59 | Admitting: Adult Health

## 2021-05-20 ENCOUNTER — Ambulatory Visit: Payer: 59 | Admitting: Adult Health

## 2021-06-05 ENCOUNTER — Ambulatory Visit (INDEPENDENT_AMBULATORY_CARE_PROVIDER_SITE_OTHER): Payer: 59 | Admitting: Adult Health

## 2021-06-05 ENCOUNTER — Other Ambulatory Visit: Payer: Self-pay

## 2021-06-05 ENCOUNTER — Encounter: Payer: Self-pay | Admitting: Adult Health

## 2021-06-05 VITALS — BP 126/73 | HR 84 | Ht 64.5 in | Wt 190.5 lb

## 2021-06-05 DIAGNOSIS — Z713 Dietary counseling and surveillance: Secondary | ICD-10-CM

## 2021-06-05 DIAGNOSIS — Z6832 Body mass index (BMI) 32.0-32.9, adult: Secondary | ICD-10-CM | POA: Insufficient documentation

## 2021-06-05 MED ORDER — PHENTERMINE HCL 37.5 MG PO TABS: 37.5000 mg | ORAL_TABLET | Freq: Every day | ORAL | 0 refills | Status: DC

## 2021-06-05 NOTE — Progress Notes (Signed)
  Subjective:     Patient ID: Sydney Mccall, female   DOB: [birth year], 35 y.o.   MRN: [id number]  HPI Sydney Mccall is a 35 year old black female,single, [id number], in to get back on adipex, has been off a while, has gained about 5 lbs, but has not been working out. Lab Results  Component Value Date   DIAGPAP  08/13/2020    - Negative for intraepithelial lesion or malignancy (NILM)   HPV NOT DETECTED 04/01/2017   [place] Negative 08/13/2020    Review of Systems +weight gain Reviewed past medical,surgical, social and family history. Reviewed medications and allergies.     Objective:   Physical Exam BP 126/73 (BP Location: Left Arm, Patient Position: Sitting, Cuff Size: Normal)   Pulse 84   Ht 5' 4.5" (1.638 m)   Wt 190 lb 8 oz (86.4 kg)   LMP 06/02/2021   BMI 32.19 kg/m  Skin warm and dry. Lungs: clear to ausculation bilaterally. Cardiovascular: regular rate and rhythm.    Fall risk is low  Upstream - 06/05/21 1630       Pregnancy Intention Screening   Does the patient want to become pregnant in the next year? No    Does the patient's partner want to become pregnant in the next year? No    Would the patient like to discuss contraceptive options today? No      Contraception Wrap Up   Current Method Female Sterilization    End Method Female Sterilization    Contraception Counseling Provided No             Assessment:     1. Weight loss counseling, encounter for Will rx adipex Increase water  Get back in the gym  2. Body mass index 32.0-32.9, adult     Plan:     Follow up in 4 weeks

## 2021-07-04 ENCOUNTER — Ambulatory Visit (INDEPENDENT_AMBULATORY_CARE_PROVIDER_SITE_OTHER): Payer: 59 | Admitting: Adult Health

## 2021-07-04 ENCOUNTER — Other Ambulatory Visit: Payer: Self-pay

## 2021-07-04 ENCOUNTER — Encounter: Payer: Self-pay | Admitting: Adult Health

## 2021-07-04 VITALS — BP 129/69 | HR 93 | Ht 64.5 in | Wt 186.4 lb

## 2021-07-04 DIAGNOSIS — Z713 Dietary counseling and surveillance: Secondary | ICD-10-CM

## 2021-07-04 DIAGNOSIS — Z6831 Body mass index (BMI) 31.0-31.9, adult: Secondary | ICD-10-CM | POA: Diagnosis not present

## 2021-07-04 MED ORDER — PHENTERMINE HCL 37.5 MG PO TABS: 37.5000 mg | ORAL_TABLET | Freq: Every day | ORAL | 0 refills | Status: DC

## 2021-07-04 MED ORDER — VALACYCLOVIR HCL 500 MG PO TABS: ORAL_TABLET | ORAL | 12 refills | Status: DC

## 2021-07-04 NOTE — Progress Notes (Signed)
  Subjective:     Patient ID: Sydney Mccall, female   DOB: [birth year], 35 y.o.   MRN: [id number]  HPI Shayma is a 34 year old black female,single, [id number] in for weight and BP check.  Lab Results  Component Value Date   DIAGPAP  08/13/2020    - Negative for intraepithelial lesion or malignancy (NILM)   HPV NOT DETECTED 04/01/2017   [place] Negative 08/13/2020    Review of Systems +weigh loss   Reviewed past medical,surgical, social and family history. Reviewed medications and allergies.  Objective:   Physical Exam BP 129/69 (BP Location: Left Arm, Patient Position: Sitting, Cuff Size: Normal)   Pulse 93   Ht 5' 4.5" (1.638 m)   Wt 186 lb 6.4 oz (84.6 kg)   BMI 31.50 kg/m     Skin warm and dry. Lungs: clear to ausculation bilaterally. Cardiovascular: regular rate and rhythm. Has lost 4 more pounds.  Upstream - 07/04/21 1638       Pregnancy Intention Screening   Does the patient want to become pregnant in the next year? No    Does the patient's partner want to become pregnant in the next year? No    Would the patient like to discuss contraceptive options today? No      Contraception Wrap Up   Current Method Female Sterilization    End Method Female Sterilization    Contraception Counseling Provided No             Assessment:     1. Weight loss counseling, encounter for Will refill adipex Meds ordered this encounter  Medications   phentermine (ADIPEX-P) 37.5 MG tablet    Sig: Take 1 tablet (37.5 mg total) by mouth daily before breakfast.    Dispense:  30 tablet    Refill:  0    Order Specific Question:   Supervising Provider    Answer:   Tania Ade H [[id number]]   valACYclovir (VALTREX) 500 MG tablet    Sig: TAKE ONE (1) TABLET BY MOUTH EVERY DAY    Dispense:  30 tablet    Refill:  12    PREVIOUSLY FROM [facility]    Order Specific Question:   Supervising Provider    Answer:   Tania Ade H [[id number]]     2. Body mass index 31.0-31.9, adult     Plan:      Follow up prn

## 2021-10-10 ENCOUNTER — Other Ambulatory Visit: Payer: Self-pay | Admitting: Adult Health

## 2021-11-29 DIAGNOSIS — H5213 Myopia, bilateral: Secondary | ICD-10-CM | POA: Diagnosis not present

## 2021-11-29 DIAGNOSIS — H52223 Regular astigmatism, bilateral: Secondary | ICD-10-CM | POA: Diagnosis not present

## 2021-12-26 ENCOUNTER — Ambulatory Visit: Payer: Medicaid Other | Admitting: Adult Health

## 2021-12-26 ENCOUNTER — Other Ambulatory Visit: Payer: Self-pay

## 2021-12-26 ENCOUNTER — Encounter: Payer: Self-pay | Admitting: Adult Health

## 2021-12-26 VITALS — BP 113/68 | HR 87 | Ht 64.5 in | Wt 198.0 lb

## 2021-12-26 DIAGNOSIS — Z6833 Body mass index (BMI) 33.0-33.9, adult: Secondary | ICD-10-CM | POA: Diagnosis not present

## 2021-12-26 DIAGNOSIS — G8929 Other chronic pain: Secondary | ICD-10-CM | POA: Diagnosis not present

## 2021-12-26 DIAGNOSIS — Z713 Dietary counseling and surveillance: Secondary | ICD-10-CM

## 2021-12-26 DIAGNOSIS — R519 Headache, unspecified: Secondary | ICD-10-CM

## 2021-12-26 MED ORDER — PHENTERMINE HCL 37.5 MG PO TABS: 37.5000 mg | ORAL_TABLET | Freq: Every day | ORAL | 0 refills | Status: DC

## 2021-12-26 NOTE — Progress Notes (Signed)
?  Subjective:  ?  ? Patient ID: Sydney Mccall, female   DOB: [birth year], 36 y.o.   MRN: [id number] ? ?HPI ?Sydney Mccall is a 36 year old black female, single, [id number], in requesting to start phentermine for weight loss, has used in past with good results. ?She says she has chronic headache on the left, for over a year now, no loss of vision sometimes sensitive to light, will use ibuprofen, does not always help. She wants MRI or something, while has medicaid.  ? ?Lab Results  ?Component Value Date  ? DIAGPAP  08/13/2020  ?  - Negative for intraepithelial lesion or malignancy (NILM)  ? HPV NOT DETECTED 04/01/2017  ? [facility] Negative 08/13/2020  ?  ?Review of Systems ?+weight gain ?+chronic headache ?Reviewed past medical,surgical, social and family history. Reviewed medications and allergies.  ?   ?Objective:  ? Physical Exam ?BP 113/68 (BP Location: Right Arm, Patient Position: Sitting, Cuff Size: Normal)   Pulse 87   Ht 5' 4.5" (1.638 m)   Wt 198 lb (89.8 kg)   LMP 12/23/2021   BMI 33.46 kg/m?   ?  Skin warm and dry.  Lungs: clear to ausculation bilaterally. Cardiovascular: regular rate and rhythm.  ? [facility] - 12/26/21 1120   ? ?  ? Pregnancy Intention Screening  ? Does the patient want to become pregnant in the next year? No   ? Does the patient's partner want to become pregnant in the next year? No   ? Would the patient like to discuss contraceptive options today? No   ?  ? Contraception Wrap Up  ? Current Method Female Sterilization   ? End Method Female Sterilization   ? Contraception Counseling Provided No   ? ?  ?  ? ?  ?  ?Assessment:  ?   ?1. Weight loss counseling, encounter for ?Increase exercise ?Increase water ?Will rx phenetermine ?Meds ordered this encounter  ?Medications  ? phentermine (ADIPEX-P) 37.5 MG tablet  ?  Sig: Take 1 tablet (37.5 mg total) by mouth daily before breakfast.  ?  Dispense:  30 tablet  ?  Refill:  0  ?  Order Specific Question:   Supervising Provider  ?  Answer:   Tania Ade H  [[id number]]  ?  ? ?2. Body mass index 33.0-33.9, adult ? ?3. Chronic left-sided headaches ?Will refer to [facility] for evaluation ?   ?Plan:  ?   ?Return in 4 weeks for weight and BP check  ?   ?

## 2022-01-13 ENCOUNTER — Ambulatory Visit (INDEPENDENT_AMBULATORY_CARE_PROVIDER_SITE_OTHER): Payer: Medicaid Other

## 2022-01-13 DIAGNOSIS — R399 Unspecified symptoms and signs involving the genitourinary system: Secondary | ICD-10-CM | POA: Diagnosis not present

## 2022-01-13 LAB — POCT URINALYSIS DIPSTICK OB
Blood, UA: NEGATIVE
Glucose, UA: NEGATIVE
Ketones, UA: NEGATIVE
Leukocytes, UA: NEGATIVE
Nitrite, UA: NEGATIVE
POC,PROTEIN,UA: NEGATIVE

## 2022-01-13 NOTE — Progress Notes (Signed)
? ?  NURSE VISIT- UTI SYMPTOMS  ? ?SUBJECTIVE:  ?Sydney Mccall is a 36 y.o. [phone] female here for UTI symptoms. She is a GYN patient. She reports flank pain on the left, urinary frequency, urinary urgency, and burning at end of urination . ? ?OBJECTIVE:  ?LMP 12/23/2021   ?Appears well, in no apparent distress ? ?No results found for this or any previous visit (from the past 24 hour(s)). ? ?ASSESSMENT: ?GYN patient with UTI symptoms and negative nitrites ? ?PLAN: ?Discussed with Derrek Monaco, AGNP   ?Rx sent by provider today: No ?Urine culture sent ?Call or return to clinic prn if these symptoms worsen or fail to improve as anticipated. ?Follow-up: as needed  ? ?Nyella Eckels A Bernadene Garside  ?01/13/2022 ?3:54 PM  ?

## 2022-01-14 LAB — URINALYSIS, ROUTINE W REFLEX MICROSCOPIC
Bilirubin, UA: NEGATIVE
Glucose, UA: NEGATIVE
Ketones, UA: NEGATIVE
Leukocytes,UA: NEGATIVE
Nitrite, UA: NEGATIVE
Protein,UA: NEGATIVE
RBC, UA: NEGATIVE
Specific Gravity, UA: 1.011 (ref 1.005–1.030)
Urobilinogen, Ur: 0.2 mg/dL (ref 0.2–1.0)
pH, UA: 6 (ref 5.0–7.5)

## 2022-01-14 NOTE — Progress Notes (Signed)
? ?Referring:  ?Estill Dooms, NP ?[address] ?[name] ?[place],  [place] [address] ? ?PCP: ?Patient, No Pcp Per (Inactive) ? ?Neurology was asked to evaluate Sydney Mccall, a 36 year old female for a chief complaint of headaches.  Our recommendations of care will be communicated by shared medical record.   ? ?CC:  headaches ? ?History provided from self ? ?HPI:  ?Medical co-morbidities: none ? ?The patient presents for evaluation of headaches which began last year. They are described as occipital pressure with associated nausea and blurred vision. They can last for several hours at a time. Headaches would initially just occur around her menstrual cycle, now they are occurring other times as well. Frequency is variable. She takes ibuprofen as needed which helps a little but does not resolve the pain. ? ?She has also noticed increased trouble with her short term memory and with concentration over the past year. Recently could not remember who the president was. ? ?Headache History: ?Onset: 1 year ago ?Triggers: menstrual cycle ?Aura: blurred vision ?Location: bioccipital ?Quality/Description: pressure ?Associated Symptoms: ? Photophobia: no ? Phonophobia: no ? Nausea: yes ?Worse with activity?: no ?Duration of headaches: several hours ? ?Headache days per month: 6 ?Headache free days per month: 24 ? ?Current Treatment: ?Abortive ?Ibuprofen ? ?Preventative ?none ? ?Prior Therapies                                 ?ibuprofen ? ?LABS: ?CBC ?   ?Component Value Date/Time  ? WBC 5.8 03/07/2014 1923  ? RBC 2.99 (L) 03/07/2014 1923  ? HGB 8.7 (L) 03/07/2014 1923  ? HCT 26.4 (L) 03/07/2014 1923  ? PLT 192 03/07/2014 1923  ? MCV 88.3 03/07/2014 1923  ? MCH 29.1 03/07/2014 1923  ? MCHC 33.0 03/07/2014 1923  ? RDW 14.3 03/07/2014 1923  ? LYMPHSABS 1.2 03/07/2014 1923  ? MONOABS 0.7 03/07/2014 1923  ? EOSABS 0.1 03/07/2014 1923  ? BASOSABS 0.0 03/07/2014 1923  ? ? ?  Latest Ref Rng & Units 03/07/2014  ?  7:23 PM 03/01/2014  ?   9:43 AM 02/28/2014  ? 12:00 PM  ?CMP  ?Glucose 70 - 99 mg/dL 86   90   102    ?BUN 6 - 23 mg/dL 13    7    ?Creatinine 0.50 - 1.10 mg/dL 0.83    0.70    ?Sodium 137 - 147 mEq/L 139   140   138    ?Potassium 3.7 - 5.3 mEq/L 4.2   3.7   3.9    ?Chloride 96 - 112 mEq/L 103    102    ?CO2 19 - 32 mEq/L 25    24    ?Calcium 8.4 - 10.5 mg/dL 9.4    8.9    ?Total Protein 6.0 - 8.3 g/dL 7.9    7.3    ?Total Bilirubin 0.3 - 1.2 mg/dL 0.4    0.3    ?Alkaline Phos 39 - 117 U/L 73    78    ?AST 0 - 37 U/L 15    16    ?ALT 0 - 35 U/L 9    10    ? ? ? ?IMAGING:  ?none ? ?Current Outpatient Medications on File Prior to Visit  ?Medication Sig Dispense Refill  ? phentermine (ADIPEX-P) 37.5 MG tablet Take 1 tablet (37.5 mg total) by mouth daily before breakfast. 30 tablet 0  ?  valACYclovir (VALTREX) 500 MG tablet TAKE ONE (1) TABLET BY MOUTH EVERY DAY 30 tablet 12  ? ?No current facility-administered medications on file prior to visit.  ? ? ? ?Allergies: ?Allergies  ?Allergen Reactions  ? Iodine Anaphylaxis and Swelling  ?  Throat itchy and swells ?  ? ? ?Family History: ?Migraine or other headaches in the family:  no ?Aneurysms in a first degree relative:  no ?Brain tumors in the family:  no ?Other neurological illness in the family:   no ? ?Past Medical History: ?Past Medical History:  ?Diagnosis Date  ? BV (bacterial vaginosis) 09/06/2014  ? Ectopic pregnancy 02/28/2014  ? Genital herpes   ? Herpes genitalia   ? HSV-2 infection complicating pregnancy 8/83/2549  ? On acyclovir '400mg'$  TID   ? NSVD (normal spontaneous vaginal delivery) 08/24/2013  ? Postpartum hemorrhage   ? history  ? Postpartum hemorrhage 07/13/2012  ? Pregnant   ? Vaginal itching 09/06/2014  ? ? ?Past Surgical History ?Past Surgical History:  ?Procedure Laterality Date  ? BREAST CYST EXCISION    ? DIAGNOSTIC LAPAROSCOPY WITH REMOVAL OF ECTOPIC PREGNANCY Left 03/01/2014  ? Procedure: LAPAROSCOPIC REMOVAL OF LEFT ECTOPIC PREGNANCY;  Surgeon: Florian Buff, MD;  Location:  [facility];  Service: Gynecology;  Laterality: Left;  ? LAPAROSCOPIC BILATERAL SALPINGECTOMY Bilateral 03/01/2014  ? Procedure: LAPAROSCOPIC BILATERAL SALPINGECTOMY;  Surgeon: Florian Buff, MD;  Location: [facility];  Service: Gynecology;  Laterality: Bilateral;  ? TUBAL LIGATION Bilateral 08/24/2013  ? Procedure: POST PARTUM TUBAL LIGATION;  Surgeon: Mora Bellman, MD;  Location: [facility];  Service: Gynecology;  Laterality: Bilateral;  ? ? ?Social History: ?Social History  ? ?Tobacco Use  ? Smoking status: Never  ? Smokeless tobacco: Never  ?Vaping Use  ? Vaping Use: Never used  ?Substance Use Topics  ? Alcohol use: No  ? Drug use: No  ? ? ?ROS: ?Negative for fevers, chills. Positive for headaches, memory loss. All other systems reviewed and negative unless stated otherwise in HPI. ? ? ?Physical Exam:  ? ?Vital Signs: ?LMP 12/23/2021  ?GENERAL: well appearing,in no acute distress,alert ?SKIN:  Color, texture, turgor normal. No rashes or lesions ?HEAD:  Normocephalic/atraumatic. ?CV:  RRR ?RESP: Normal respiratory effort ?MSK: +tenderness to palpation over neck bilaterally ? ?NEUROLOGICAL: ?Mental Status: Alert, oriented to person, place and time,Follows commands ?Cranial Nerves: PERRL, visual fields intact to confrontation, extraocular movements intact, facial sensation intact, no facial droop or ptosis, hearing grossly intact, no dysarthria ?Motor: muscle strength 5/5 both upper and lower extremities ?Reflexes: 2+ throughout ?Sensation: intact to light touch all 4 extremities ?Coordination: Finger-to- nose-finger intact bilaterally ?Gait: normal-based ? ? ?IMPRESSION: ?36 year old female who presents for evaluation of worsening headaches and memory changes over the past year. Other than nausea she does not have migrainous features associated with her headache. She is very concerned today about an underlying structural process causing her headaches. Will order MRI brain for worsening headaches, memory changes, and blurred  vision. She does have significant neck tension on exam, will refer to physical therapy for cervicalgia. She would prefer not to start a medication at this time. ? ?PLAN: ?-Blood work: CBC, CMP, TSH, B12 ?-MRI brain ?-Referral to neck PT ?-consider PRN muscle relaxer if headaches do not improve with neck PT ? ?I spent a total of 23 minutes chart reviewing and counseling the patient. Headache education was done. Discussed treatment options including physical therapy. Discussed medication side effects, adverse reactions and drug interactions. Written Scientist, clinical (histocompatibility and immunogenetics)  and patient instructions outlining all of the above were given. ? ?Follow-up: 4 months ? ? ?Genia Harold, MD ?01/14/2022   ?11:57 AM ? ? ?

## 2022-01-15 ENCOUNTER — Encounter: Payer: Self-pay | Admitting: Psychiatry

## 2022-01-15 ENCOUNTER — Ambulatory Visit: Payer: Medicaid Other | Admitting: Psychiatry

## 2022-01-15 VITALS — BP 122/69 | HR 91 | Ht 63.0 in | Wt 199.0 lb

## 2022-01-15 DIAGNOSIS — M542 Cervicalgia: Secondary | ICD-10-CM | POA: Diagnosis not present

## 2022-01-15 DIAGNOSIS — R519 Headache, unspecified: Secondary | ICD-10-CM | POA: Diagnosis not present

## 2022-01-15 DIAGNOSIS — R413 Other amnesia: Secondary | ICD-10-CM

## 2022-01-15 NOTE — Patient Instructions (Addendum)
MRI brain ?Blood work - thyroid, B12, electrolytes ?Physical therapy for the neck ?

## 2022-01-16 ENCOUNTER — Telehealth: Payer: Self-pay

## 2022-01-16 LAB — VITAMIN B12: Vitamin B-12: 770 pg/mL (ref 232–1245)

## 2022-01-16 LAB — CBC WITH DIFFERENTIAL/PLATELET
Basophils Absolute: 0.1 10*3/uL (ref 0.0–0.2)
Basos: 2 %
EOS (ABSOLUTE): 0.2 10*3/uL (ref 0.0–0.4)
Eos: 4 %
Hematocrit: 27.3 % — ABNORMAL LOW (ref 34.0–46.6)
Hemoglobin: 7.9 g/dL — ABNORMAL LOW (ref 11.1–15.9)
Immature Grans (Abs): 0 10*3/uL (ref 0.0–0.1)
Immature Granulocytes: 0 %
Lymphocytes Absolute: 1.5 10*3/uL (ref 0.7–3.1)
Lymphs: 33 %
MCH: 20.4 pg — ABNORMAL LOW (ref 26.6–33.0)
MCHC: 28.9 g/dL — ABNORMAL LOW (ref 31.5–35.7)
MCV: 70 fL — ABNORMAL LOW (ref 79–97)
Monocytes Absolute: 0.6 10*3/uL (ref 0.1–0.9)
Monocytes: 14 %
Neutrophils Absolute: 2.1 10*3/uL (ref 1.4–7.0)
Neutrophils: 47 %
Platelets: 138 10*3/uL — ABNORMAL LOW (ref 150–450)
RBC: 3.88 x10E6/uL (ref 3.77–5.28)
RDW: 17.2 % — ABNORMAL HIGH (ref 11.7–15.4)
WBC: 4.5 10*3/uL (ref 3.4–10.8)

## 2022-01-16 LAB — COMPREHENSIVE METABOLIC PANEL
ALT: 10 IU/L (ref 0–32)
AST: 16 IU/L (ref 0–40)
Albumin/Globulin Ratio: 1.2 (ref 1.2–2.2)
Albumin: 4 g/dL (ref 3.8–4.8)
Alkaline Phosphatase: 79 IU/L (ref 44–121)
BUN/Creatinine Ratio: 12 (ref 9–23)
BUN: 9 mg/dL (ref 6–20)
Bilirubin Total: <0.2 mg/dL (ref 0.0–1.2)
CO2: 22 mmol/L (ref 20–29)
Calcium: 8.9 mg/dL (ref 8.7–10.2)
Chloride: 105 mmol/L (ref 96–106)
Creatinine, Ser: 0.78 mg/dL (ref 0.57–1.00)
Globulin, Total: 3.4 g/dL (ref 1.5–4.5)
Glucose: 104 mg/dL — ABNORMAL HIGH (ref 70–99)
Potassium: 4.9 mmol/L (ref 3.5–5.2)
Sodium: 138 mmol/L (ref 134–144)
Total Protein: 7.4 g/dL (ref 6.0–8.5)
eGFR: 100 mL/min/{1.73_m2} (ref >59)

## 2022-01-16 LAB — TSH: TSH: 0.775 u[IU]/mL (ref 0.450–4.500)

## 2022-01-16 NOTE — Telephone Encounter (Signed)
-----   Message from Genia Harold, MD sent at 01/16/2022  8:38 AM EDT ----- ?Hemoglobin is low. Anemia can cause headaches and fatigue. I would have her reach out to her family doctor to see if she needs further workup for her low hemoglobin. Her other labs are normal ?

## 2022-01-16 NOTE — Telephone Encounter (Signed)
Contacted pt regarding labs, informed her Hemoglobin is low. Anemia can cause headaches and fatigue. Dr Billey Gosling recommends she reach out to her family doctor to see if she needs further workup for her low hemoglobin. Her other labs are normal. Advised to call office back with questions as she had none at the time and was appreciative.  ?

## 2022-01-18 LAB — URINE CULTURE

## 2022-01-20 ENCOUNTER — Other Ambulatory Visit: Payer: Self-pay | Admitting: Obstetrics & Gynecology

## 2022-01-20 ENCOUNTER — Telehealth: Payer: Self-pay | Admitting: Adult Health

## 2022-01-20 DIAGNOSIS — N3000 Acute cystitis without hematuria: Secondary | ICD-10-CM

## 2022-01-20 MED ORDER — FLUCONAZOLE 150 MG PO TABS: 150.0000 mg | ORAL_TABLET | Freq: Once | ORAL | 0 refills | Status: AC

## 2022-01-20 MED ORDER — SULFAMETHOXAZOLE-TRIMETHOPRIM 800-160 MG PO TABS: 1.0000 | ORAL_TABLET | Freq: Two times a day (BID) | ORAL | 0 refills | Status: AC

## 2022-01-20 NOTE — Telephone Encounter (Signed)
Pt calling stating no one has called her about the test that she did here in our office patient is very upset.  ?

## 2022-01-20 NOTE — Telephone Encounter (Signed)
Spoke with Dr Nelda Marseille regarding test results. Returned pt's call, two identifiers used. Informed pt that test resulted on Saturday and the providers were taking care of reviewing those today. Dr Nelda Marseille sent in an antibiotic and pt requested yeast infection treatment prophylatically. Pt confirmed understanding. ?

## 2022-01-20 NOTE — Progress Notes (Addendum)
Rx sent in for UTI ? ?Pt also requesting treatment for yeast as she notes she typically gets infection after ?

## 2022-01-20 NOTE — Addendum Note (Signed)
Addended by: Annalee Genta on: 01/20/2022 11:18 AM ? ? Modules accepted: Orders ? ?

## 2022-01-21 ENCOUNTER — Telehealth: Payer: Self-pay | Admitting: Psychiatry

## 2022-01-21 NOTE — Therapy (Incomplete)
?OUTPATIENT PHYSICAL THERAPY CERVICAL EVALUATION ? ? ?Patient Name: Sydney Mccall ?MRN: [id number] ?DOB:[birth year], 35 y.o., female ?Today's Date: 01/21/2022 ? ? ? ?Past Medical History:  ?Diagnosis Date  ? BV (bacterial vaginosis) 09/06/2014  ? Ectopic pregnancy 02/28/2014  ? Genital herpes   ? Headache   ? Herpes genitalia   ? HSV-2 infection complicating pregnancy 46/80/3212  ? On acyclovir '400mg'$  TID   ? NSVD (normal spontaneous vaginal delivery) 08/24/2013  ? Postpartum hemorrhage   ? history  ? Postpartum hemorrhage 07/13/2012  ? Pregnant   ? Vaginal itching 09/06/2014  ? ?Past Surgical History:  ?Procedure Laterality Date  ? BREAST CYST EXCISION    ? DIAGNOSTIC LAPAROSCOPY WITH REMOVAL OF ECTOPIC PREGNANCY Left 03/01/2014  ? Procedure: LAPAROSCOPIC REMOVAL OF LEFT ECTOPIC PREGNANCY;  Surgeon: Florian Buff, MD;  Location: [facility];  Service: Gynecology;  Laterality: Left;  ? LAPAROSCOPIC BILATERAL SALPINGECTOMY Bilateral 03/01/2014  ? Procedure: LAPAROSCOPIC BILATERAL SALPINGECTOMY;  Surgeon: Florian Buff, MD;  Location: [facility];  Service: Gynecology;  Laterality: Bilateral;  ? TUBAL LIGATION Bilateral 08/24/2013  ? Procedure: POST PARTUM TUBAL LIGATION;  Surgeon: Mora Bellman, MD;  Location: [facility];  Service: Gynecology;  Laterality: Bilateral;  ? ?Patient Active Problem List  ? Diagnosis Date Noted  ? Chronic left-sided headaches 12/26/2021  ? Body mass index 31.0-31.9, adult 07/04/2021  ? Body mass index 32.0-32.9, adult 06/05/2021  ? Body mass index 35.0-35.9, adult 12/27/2020  ? Weight loss counseling, encounter for 12/27/2020  ? Body mass index 33.0-33.9, adult 03/07/2020  ? Body mass index 34.0-34.9, adult 02/08/2020  ? KNEE JOINT INSTABILITY 08/13/2009  ? ? ?PCP: Patient, No Pcp Per (Inactive) ? ?REFERRING PROVIDER: Genia Harold, MD ? ?REFERRING DIAG: M54.2 (ICD-10-CM) - Cervicalgia  ? ?THERAPY DIAG:  ?No diagnosis found. ? ?ONSET DATE: 01/15/22 date of referral ? ?SUBJECTIVE:                                                                                                                                                                                                         ? ?SUBJECTIVE STATEMENT: ?Headaches begun about 1 year ago. Headaches are occipital pressure with nausea and blurred vision. Pt denies light or sound sensitivity. Pt reports of 6 headache days per month. Pt is taking ibuprofen as needed. No rescue headache medication. ? ?PERTINENT HISTORY:  ?Chronic headaches ? ?PAIN:  ?Are you having pain? {OPRCPAIN:27236} ? ?PRECAUTIONS: None ? ?WEIGHT BEARING RESTRICTIONS No ? ?FALLS:  ?Has patient fallen in last 6 months? {fallsyesno:27318} ? ?LIVING ENVIRONMENT: ?Lives with: {[facility] lives  JJOA:41660::"YTKZS with their family"} ?Lives in: {Lives in:25570} ?Stairs: {opstairs:27293} ?Has following equipment at home: {Assistive devices:23999} ? ?OCCUPATION: *** ? ?PLOF: {PLOF:24004} ? ?PATIENT GOALS *** ? ?OBJECTIVE:  ? ?PATIENT SURVEYS:  ?{rehab surveys:24030} ? ? ?COGNITION: ?Overall cognitive status: Within functional limits for tasks assessed ? ? ? ? ?POSTURE:  ?*** ? ?PALPATION: ?***  ? ?CERVICAL ROM:  ? ?Active ROM A/PROM (deg) ?01/21/2022  ?Flexion   ?Extension   ?Right lateral flexion   ?Left lateral flexion   ?Right rotation   ?Left rotation   ? (Blank rows = not tested) ? ?UE ROM: ? ?Active ROM Right ?01/21/2022 Left ?01/21/2022  ?Shoulder flexion    ?Shoulder extension    ?Shoulder abduction    ?Shoulder adduction    ?Shoulder extension    ?Shoulder internal rotation    ?Shoulder external rotation    ?Elbow flexion    ?Elbow extension    ?Wrist flexion    ?Wrist extension    ?Wrist ulnar deviation    ?Wrist radial deviation    ?Wrist pronation    ?Wrist supination    ? (Blank rows = not tested) ? ?UE MMT: ? ?MMT Right ?01/21/2022 Left ?01/21/2022  ?Shoulder flexion    ?Shoulder extension    ?Shoulder abduction    ?Shoulder adduction    ?Shoulder extension    ?Shoulder internal rotation    ?Shoulder external  rotation    ?Middle trapezius    ?Lower trapezius    ?Elbow flexion    ?Elbow extension    ?Wrist flexion    ?Wrist extension    ?Wrist ulnar deviation    ?Wrist radial deviation    ?Wrist pronation    ?Wrist supination    ?Grip strength    ? (Blank rows = not tested) ? ?CERVICAL SPECIAL TESTS:  ?Spurling's test: {pos/neg:25243} ? ? ? ?PATIENT SURVEYS:  ?NDI *** ? ?TODAY'S TREATMENT:  ?*** ? ? ?PATIENT EDUCATION:  ?Education details: see above ?Person educated: Patient ?Education method: Explanation ?Education comprehension: verbalized understanding ? ? ?HOME EXERCISE PROGRAM: ?*** ? ?ASSESSMENT: ? ?CLINICAL IMPRESSION: ?Patient is a 36 y.o. female who was seen today for physical therapy evaluation and treatment for chronic headaches and neck pain.  ? ? ?OBJECTIVE IMPAIRMENTS {opptimpairments:25111}.  ? ?ACTIVITY LIMITATIONS {activity limitations:25113}.  ? ?PERSONAL FACTORS {Personal factors:25162} are also affecting patient's functional outcome.  ? ? ?REHAB POTENTIAL: Good ? ?CLINICAL DECISION MAKING: Stable/uncomplicated ? ?EVALUATION COMPLEXITY: Low ? ? ?GOALS: ?Goals reviewed with patient? Yes ? ?SHORT TERM GOALS: Target date: {follow up:25551} ? ?*** ?Baseline: *** ?Goal status: {GOALSTATUS:25110} ? ?2.  *** ?Baseline: *** ?Goal status: {GOALSTATUS:25110} ? ?3.  *** ?Baseline: *** ?Goal status: {GOALSTATUS:25110} ? ?4.  *** ?Baseline: *** ?Goal status: {GOALSTATUS:25110} ? ?5.  *** ?Baseline: *** ?Goal status: {GOALSTATUS:25110} ? ?6.  *** ?Baseline: *** ?Goal status: {GOALSTATUS:25110} ? ?LONG TERM GOALS: Target date: {follow up:25551} ? ?*** ?Baseline: *** ?Goal status: {GOALSTATUS:25110} ? ?2.  *** ?Baseline: *** ?Goal status: {GOALSTATUS:25110} ? ?3.  *** ?Baseline: *** ?Goal status: {GOALSTATUS:25110} ? ?4.  *** ?Baseline: *** ?Goal status: {GOALSTATUS:25110} ? ?5.  *** ?Baseline: *** ?Goal status: {GOALSTATUS:25110} ? ?6.  *** ?Baseline: *** ?Goal status: {GOALSTATUS:25110} ? ? ?PLAN: ?PT FREQUENCY:  {rehab frequency:25116} ? ?PT DURATION: {rehab duration:25117} ? ?PLANNED INTERVENTIONS: Therapeutic exercises, Therapeutic activity, Neuromuscular re-education, Patient/Family education, Joint manipulation, Joint mobilization, Dry Needling, Electrical stimulation, Spinal manipulation, Spinal mobilization, Cryotherapy, Moist heat, and Manual therapy ? ?PLAN FOR NEXT SESSION: *** ? ? ?Kerrie Pleasure,  PT ?01/21/2022, 8:15 PM ? ? ? ? ? ?

## 2022-01-21 NOTE — Telephone Encounter (Signed)
[facility] Josem Kaufmann: [id number] (exp. 4/18/253 to 03/21/22) order sent to GI. They will reach out to the patient to schedule.  ?

## 2022-01-22 ENCOUNTER — Ambulatory Visit: Payer: Medicaid Other | Attending: Psychiatry

## 2022-01-22 DIAGNOSIS — M542 Cervicalgia: Secondary | ICD-10-CM | POA: Insufficient documentation

## 2022-01-22 DIAGNOSIS — R293 Abnormal posture: Secondary | ICD-10-CM | POA: Insufficient documentation

## 2022-01-24 ENCOUNTER — Encounter: Payer: Self-pay | Admitting: Physical Therapy

## 2022-01-24 ENCOUNTER — Ambulatory Visit: Payer: Medicaid Other | Admitting: Physical Therapy

## 2022-01-24 DIAGNOSIS — R293 Abnormal posture: Secondary | ICD-10-CM | POA: Diagnosis present

## 2022-01-24 DIAGNOSIS — M542 Cervicalgia: Secondary | ICD-10-CM | POA: Diagnosis present

## 2022-01-24 NOTE — Therapy (Signed)
?OUTPATIENT PHYSICAL THERAPY CERVICAL EVALUATION ? ? ?Patient Name: Sydney Mccall ?MRN: [id number] ?DOB:11-08-1985, 36 y.o., female ?Today's Date: 01/24/2022 ? ? PT End of Session - 01/24/22 0841   ? ? Visit Number 1   ? Number of Visits 5   ? Date for PT Re-Evaluation 03/25/22   ? Authorization Type [facility] Medicaid   ? PT Start Time 0802   ? PT Stop Time [phone]   ? PT Time Calculation (min) 37 min   ? Activity Tolerance No increased pain;Patient tolerated treatment well   ? Behavior During Therapy [facility] for tasks assessed/performed   ? ?  ?  ? ?  ? ? ?Past Medical History:  ?Diagnosis Date  ? BV (bacterial vaginosis) 09/06/2014  ? Ectopic pregnancy 02/28/2014  ? Genital herpes   ? Headache   ? Herpes genitalia   ? HSV-2 infection complicating pregnancy 69/48/5462  ? On acyclovir '400mg'$  TID   ? NSVD (normal spontaneous vaginal delivery) 08/24/2013  ? Postpartum hemorrhage   ? history  ? Postpartum hemorrhage 07/13/2012  ? Pregnant   ? Vaginal itching 09/06/2014  ? ?Past Surgical History:  ?Procedure Laterality Date  ? BREAST CYST EXCISION    ? DIAGNOSTIC LAPAROSCOPY WITH REMOVAL OF ECTOPIC PREGNANCY Left 03/01/2014  ? Procedure: LAPAROSCOPIC REMOVAL OF LEFT ECTOPIC PREGNANCY;  Surgeon: Florian Buff, MD;  Location: [facility];  Service: Gynecology;  Laterality: Left;  ? LAPAROSCOPIC BILATERAL SALPINGECTOMY Bilateral 03/01/2014  ? Procedure: LAPAROSCOPIC BILATERAL SALPINGECTOMY;  Surgeon: Florian Buff, MD;  Location: [facility];  Service: Gynecology;  Laterality: Bilateral;  ? TUBAL LIGATION Bilateral 08/24/2013  ? Procedure: POST PARTUM TUBAL LIGATION;  Surgeon: Mora Bellman, MD;  Location: [facility];  Service: Gynecology;  Laterality: Bilateral;  ? ?Patient Active Problem List  ? Diagnosis Date Noted  ? Chronic left-sided headaches 12/26/2021  ? Body mass index 31.0-31.9, adult 07/04/2021  ? Body mass index 32.0-32.9, adult 06/05/2021  ? Body mass index 35.0-35.9, adult 12/27/2020  ? Weight loss counseling, encounter for  12/27/2020  ? Body mass index 33.0-33.9, adult 03/07/2020  ? Body mass index 34.0-34.9, adult 02/08/2020  ? KNEE JOINT INSTABILITY 08/13/2009  ? ? ?PCP: Patient, No Pcp Per (Inactive) ? ?REFERRING PROVIDER: Genia Harold, MD ? ?REFERRING DIAG: M54.2 (ICD-10-CM) - Cervicalgia  ? ?THERAPY DIAG:  ?Cervicalgia ? ?Abnormal posture ? ?ONSET DATE: 01/15/2022  ? ?SUBJECTIVE:                                                                                                                                                                                                        ? ?  SUBJECTIVE STATEMENT: ?Has been having headaches for about a year and has significant neck tension. Also has nausea with her headaches. Is scheduled for a brain MRI May 1st due to headaches, memory changes, and blurred vision.  ? ?PERTINENT HISTORY:  ?Pt w/ headaches that began last year. They are described as occipital pressure with associated nausea and blurred vision. They can last for several hours at a time. Headaches would initially just occur around her menstrual cycle, now they are occurring other times as well. Frequency is variable. She takes ibuprofen as needed which helps a little but does not resolve the pain.  ? ?PAIN:  ?Are you having pain? No - just feels tightness  ? ? ?OCCUPATION: N/A ? ?PLOF: Independent ? ?PATIENT GOALS Wants to see if she can relieve some tension in her neck to see if it will help w/ headaches.  ? ?OBJECTIVE:  ? ?DIAGNOSTIC FINDINGS:  ?Pt has MRI scheduled on May 1st.  ? ?PATIENT SURVEYS:  ?NDI 8% disability ? ? ?COGNITION: ?Overall cognitive status: Within functional limits for tasks assessed ? ? ?SENSATION: ?WFL for BUE  ? ?POSTURE:  ?Forwards head, rounded shoulders ? ?PALPATION: ?Incr TTP and tightness R>L upper trap, levator scapulae, cervical paraspinals   ? ?Hypomobility central upper/mid thoracic spine and central T1-T2 and cervicothoracic junction, upper cervical spine ?Hypomobility unilateral R upper  thoracic spine, esp at C7, T1-T4 and upper cervical spine.  ? ?CERVICAL ROM:  ? ?Active ROM A/PROM (deg) ?01/24/2022  ?Flexion 60-feels tighter on R side  ?Extension 50-feels tighter on R side  ?Right lateral flexion 55  ?Left lateral flexion 55  ?Right rotation 65  ?Left rotation 65  ? (Blank rows = not tested) ? ? ?UE MMT: ? ?  5/5 myotomes bilat.  ? ? STRENGTH: ?Mid trap 5+/5 ?Lower trap: 5+/5 incr pain w/ R side ? ?CERVICAL SPECIAL TESTS:  ?Distraction test: Negative ? ? ? ?TODAY'S TREATMENT:  ? ?Initiated HEP for posture and stretching to R>L neck musculature. See MedBridge for more details. Pt able to tolerate well.  ? ?Access Code: [email or web address] ?URL: [email or web address] ?Date: 01/24/2022 ?Prepared by: Janann August ? ?Exercises ?- Seated Scapular Retraction  - 2 x daily - 7 x weekly - 2 sets - 10 reps ?- Seated Gentle Upper Trapezius Stretch  - 2 x daily - 7 x weekly - 3 sets - 30 hold ?- Gentle Levator Scapulae Stretch  - 2 x daily - 7 x weekly - 3 sets - 30 hold ? ? ?PATIENT EDUCATION:  ?Education details: Initial HEP, clinical findings, POC. Discussed going to [address] (an orthopedic clinic instead of neuro) as pt would be better served there or pt can stay at this clinic and see the PT here that primarily works with neck pain patients. Pt would rather stay here at first to work with that PT.  ?Person educated: Patient ?Education method: Explanation and Handouts ?Education comprehension: verbalized understanding, returned demonstration, and verbal cues required ? ? ?HOME EXERCISE PROGRAM: ?Access Code: [email or web address] ?URL: [email or web address] ?Date: 01/24/2022 ?Prepared by: Janann August ? ?Exercises ?- Seated Scapular Retraction  - 2 x daily - 7 x weekly - 2 sets - 10 reps ?- Seated Gentle Upper Trapezius Stretch  - 2 x daily - 7 x weekly - 3 sets - 30 hold ?- Gentle Levator Scapulae Stretch  - 2 x daily - 7 x weekly - 3 sets - 30 hold ? ?Managed medicaid CPT codes: [phone]-  Therapeutic Exercise, [phone]- Neuro Re-education, [phone] -  Manual Therapy, 97530 - Therapeutic Activities, and 97535 - Self Care ? ? ?ASSESSMENT: ? ?CLINICAL IMPRESSION: ?Patient is a 36 year old female referred to Neuro OPPT for cervicalgia/neck pain.   Pt's PMH is significant for: no significant PMH. The following deficits were present during the exam: incr TTP R>L upper traps, cervical paraspinals, suboccipitals, levator scap, postural abnormalities, hypomobility of upper cervical spine, upper/mid thoracic spine and cerviothoracic junction. Pt would benefit from skilled PT to address these impairments and functional limitations to maximize functional mobility independence ? ? ? ?OBJECTIVE IMPAIRMENTS hypomobility, increased fascial restrictions, increased muscle spasms, impaired flexibility, postural dysfunction, pain, and headaches .  ? ?ACTIVITY LIMITATIONS  headaches and neck pain do limit pt's participation in some activities.  .  ? ?PERSONAL FACTORS Past/current experiences and Time since onset of injury/illness/exacerbation are also affecting patient's functional outcome.  ? ? ?REHAB POTENTIAL: Good ? ?CLINICAL DECISION MAKING: Stable/uncomplicated ? ?EVALUATION COMPLEXITY: Low ? ? ?GOALS: ?Goals reviewed with patient? Yes ? ?SHORT TERM GOALS: N/A ? ?LONG TERM GOALS: Target date: 02/21/2022 ? ?Pt will be independent with final HEP in order to build upon functional gains made in therapy for neck pain and tightness ? ?Baseline:  ?Goal status: INITIAL ? ?2.  Pt will subjectively report at least a 50% improvement in her headaches to improve activity tolerance. ?Baseline:  ?Goal status: INITIAL ? ?3.  Pt will score a 0% on the NDI in order to demo decr neck pain with functional activities. ?Baseline: 8% ?Goal status: INITIAL ? ? ? ? ?PLAN: ?PT FREQUENCY: 1x/week ? ?PT DURATION: 8 weeks - only plan to see pt for 4 weeks, but due to potential delay in scheduling  ? ?PLANNED INTERVENTIONS: Therapeutic exercises,  Therapeutic activity, Neuromuscular re-education, Patient/Family education, Joint mobilization, Dry Needling, Spinal mobilization, Moist heat, and Manual therapy ? ?PLAN FOR NEXT SESSION: Add to initial HEP, manual

## 2022-01-31 ENCOUNTER — Ambulatory Visit: Payer: Medicaid Other | Admitting: Adult Health

## 2022-01-31 ENCOUNTER — Other Ambulatory Visit: Payer: Medicaid Other

## 2022-02-03 ENCOUNTER — Ambulatory Visit
Admission: RE | Admit: 2022-02-03 | Discharge: 2022-02-03 | Disposition: A | Discharge status: Home / Self Care | Payer: Medicaid Other | Source: Ambulatory Visit | Attending: Psychiatry | Admitting: Psychiatry

## 2022-02-03 ENCOUNTER — Other Ambulatory Visit: Payer: Medicaid Other

## 2022-02-03 DIAGNOSIS — R519 Headache, unspecified: Secondary | ICD-10-CM

## 2022-02-03 MED ORDER — GADOBENATE DIMEGLUMINE 529 MG/ML IV SOLN
18.0000 mL | Freq: Once | INTRAVENOUS | Status: AC | PRN
  Administered 2022-02-03: 18 mL via INTRAVENOUS

## 2022-02-04 ENCOUNTER — Telehealth: Payer: Self-pay | Admitting: *Deleted

## 2022-02-04 ENCOUNTER — Other Ambulatory Visit: Payer: Self-pay | Admitting: Psychiatry

## 2022-02-04 DIAGNOSIS — E236 Other disorders of pituitary gland: Secondary | ICD-10-CM

## 2022-02-04 DIAGNOSIS — H538 Other visual disturbances: Secondary | ICD-10-CM

## 2022-02-04 NOTE — Telephone Encounter (Signed)
Called patient and informed her the MRI brain shows a partially empty sella, which can be a normal finding or can be seen in patients with increased intracranial pressure from overproduction of spinal fluid. We can determine if her pressure is elevated by looking for swelling in the back of her eyes. Dr Billey Gosling will place a referral for ophthalmology to do a dilated eye exam to look for swelling.  Answered her questions to her stated satisfaction. Patient verbalized understanding, appreciation. ?   ?

## 2022-02-05 ENCOUNTER — Telehealth: Payer: Self-pay | Admitting: Psychiatry

## 2022-02-05 NOTE — Telephone Encounter (Signed)
Referral for Ophthalmology sent to [facility] [phone]. 

## 2022-02-12 ENCOUNTER — Ambulatory Visit: Payer: Medicaid Other

## 2022-02-21 ENCOUNTER — Telehealth: Payer: Self-pay

## 2022-02-21 ENCOUNTER — Ambulatory Visit: Payer: Medicaid Other | Attending: Psychiatry

## 2022-02-21 NOTE — Therapy (Incomplete)
OUTPATIENT PHYSICAL THERAPY TREATMENT NOTE   Patient Name: Sydney Mccall MRN: [id number] DOB:[birth year], 36 y.o., female Today's Date: 02/21/2022     Past Medical History:  Diagnosis Date   BV (bacterial vaginosis) 09/06/2014   Ectopic pregnancy 02/28/2014   Genital herpes    Headache    Herpes genitalia    HSV-2 infection complicating pregnancy 14/97/0263   On acyclovir '400mg'$  TID    NSVD (normal spontaneous vaginal delivery) 08/24/2013   Postpartum hemorrhage    history   Postpartum hemorrhage 07/13/2012   Pregnant    Vaginal itching 09/06/2014   Past Surgical History:  Procedure Laterality Date   BREAST CYST EXCISION     DIAGNOSTIC LAPAROSCOPY WITH REMOVAL OF ECTOPIC PREGNANCY Left 03/01/2014   Procedure: LAPAROSCOPIC REMOVAL OF LEFT ECTOPIC PREGNANCY;  Surgeon: Florian Buff, MD;  Location: [facility];  Service: Gynecology;  Laterality: Left;   LAPAROSCOPIC BILATERAL SALPINGECTOMY Bilateral 03/01/2014   Procedure: LAPAROSCOPIC BILATERAL SALPINGECTOMY;  Surgeon: Florian Buff, MD;  Location: [facility];  Service: Gynecology;  Laterality: Bilateral;   TUBAL LIGATION Bilateral 08/24/2013   Procedure: POST PARTUM TUBAL LIGATION;  Surgeon: Mora Bellman, MD;  Location: [facility];  Service: Gynecology;  Laterality: Bilateral;   Patient Active Problem List   Diagnosis Date Noted   Chronic left-sided headaches 12/26/2021   Body mass index 31.0-31.9, adult 07/04/2021   Body mass index 32.0-32.9, adult 06/05/2021   Body mass index 35.0-35.9, adult 12/27/2020   Weight loss counseling, encounter for 12/27/2020   Body mass index 33.0-33.9, adult 03/07/2020   Body mass index 34.0-34.9, adult 02/08/2020   KNEE JOINT INSTABILITY 08/13/2009    PCP: Patient, No Pcp Per (Inactive)  REFERRING PROVIDER: Genia Harold, MD  REFERRING DIAG: M54.2 (ICD-10-CM) - Cervicalgia   THERAPY DIAG:  No diagnosis found.  ONSET DATE: 01/15/2022   SUBJECTIVE:                                                                                                                                                                                                          SUBJECTIVE STATEMENT: ***  PERTINENT HISTORY:  Pt w/ headaches that began last year. They are described as occipital pressure with associated nausea and blurred vision. They can last for several hours at a time. Headaches would initially just occur around her menstrual cycle, now they are occurring other times as well. Frequency is variable. She takes ibuprofen as needed which helps a little but does not resolve the pain.   PAIN:  Are you having pain? No - just feels  tightness    OCCUPATION: N/A  PLOF: Independent  PATIENT GOALS Wants to see if she can relieve some tension in her neck to see if it will help w/ headaches.   OBJECTIVE:   CERVICAL ROM:   Active ROM A/PROM (deg) EVAL  Flexion 60-feels tighter on R side  Extension 50-feels tighter on R side  Right lateral flexion 55  Left lateral flexion 55  Right rotation 65  Left rotation 65   (Blank rows = not tested)    TODAY'S TREATMENT:   Initiated HEP for posture and stretching to R>L neck musculature. See MedBridge for more details. Pt able to tolerate well.   Access Code: [email or web address] URL: [email or web address] Date: 01/24/2022 Prepared by: Janann August  Exercises - Seated Scapular Retraction  - 2 x daily - 7 x weekly - 2 sets - 10 reps - Seated Gentle Upper Trapezius Stretch  - 2 x daily - 7 x weekly - 3 sets - 30 hold - Gentle Levator Scapulae Stretch  - 2 x daily - 7 x weekly - 3 sets - 30 hold   PATIENT EDUCATION:  Education details: Initial HEP, clinical findings, POC. Discussed going to [address] (an orthopedic clinic instead of neuro) as pt would be better served there or pt can stay at this clinic and see the PT here that primarily works with neck pain patients. Pt would rather stay here at first to work with that PT.  Person  educated: Patient Education method: Theatre stage manager Education comprehension: verbalized understanding, returned demonstration, and verbal cues required   HOME EXERCISE PROGRAM: Access Code: [email or web address] URL: [email or web address] Date: 01/24/2022 Prepared by: Janann August  Exercises - Seated Scapular Retraction  - 2 x daily - 7 x weekly - 2 sets - 10 reps - Seated Gentle Upper Trapezius Stretch  - 2 x daily - 7 x weekly - 3 sets - 30 hold - Gentle Levator Scapulae Stretch  - 2 x daily - 7 x weekly - 3 sets - 30 hold  Managed medicaid CPT codes: 92426- Therapeutic Exercise, [phone]- Neuro Re-education, 97140 - Manual Therapy, 97530 - Therapeutic Activities, and 62229 - Self Care   ASSESSMENT:  CLINICAL IMPRESSION: ***    OBJECTIVE IMPAIRMENTS hypomobility, increased fascial restrictions, increased muscle spasms, impaired flexibility, postural dysfunction, pain, and headaches .   ACTIVITY LIMITATIONS  headaches and neck pain do limit pt's participation in some activities.  Marland Kitchen   PERSONAL FACTORS Past/current experiences and Time since onset of injury/illness/exacerbation are also affecting patient's functional outcome.    REHAB POTENTIAL: Good  CLINICAL DECISION MAKING: Stable/uncomplicated  EVALUATION COMPLEXITY: Low   GOALS: Goals reviewed with patient? Yes  SHORT TERM GOALS: N/A  LONG TERM GOALS: Target date: 03/25/22  Pt will be independent with final HEP in order to build upon functional gains made in therapy for neck pain and tightness  Baseline:  Goal status: INITIAL  2.  Pt will subjectively report at least a 50% improvement in her headaches to improve activity tolerance. Baseline:  Goal status: INITIAL  3.  Pt will score a 0% on the NDI in order to demo decr neck pain with functional activities. Baseline: 8% Goal status: INITIAL     PLAN: PT FREQUENCY: 1x/week  PT DURATION: 8 weeks - only plan to see pt for 4 weeks, but due to  potential delay in scheduling   PLANNED INTERVENTIONS: Therapeutic exercises, Therapeutic activity, Neuromuscular re-education, Patient/Family education, Joint mobilization, Dry Needling, Spinal mobilization, Moist heat, and Manual therapy  PLAN FOR NEXT SESSION: Add to initial HEP, manual therapy and stretching to upper trap, cervical paraspinals, levator scap R>L, and begin w/ gentle cervical mobilizations and to upper thoracic spine.    Kerrie Pleasure, PT, DPT  02/21/2022, 10:12 AM

## 2022-02-21 NOTE — Telephone Encounter (Signed)
Patient Name: Sydney Mccall MRN: [id number] DOB:18-Aug-1986, 36 y.o., female Today's Date: 02/21/2022  Called and spoke with patient as she missed her appt today at 10:15am. Pt thought her appt was for next Friday. Pt was reminded of her next PT appt on 02/26/22 at [place], PT 02/21/2022, 10:40 AM

## 2022-02-26 ENCOUNTER — Ambulatory Visit: Payer: Medicaid Other

## 2022-03-05 ENCOUNTER — Telehealth: Payer: Self-pay

## 2022-03-05 ENCOUNTER — Ambulatory Visit: Payer: Medicaid Other

## 2022-03-05 NOTE — Telephone Encounter (Signed)
Patient Name: Sydney Mccall MRN: [id number] DOB:09/25/1986, 36 y.o., female Today's Date: 03/05/2022  Pt has had 4 consecutive cancellations and no shows. Due to poor compliance patient will be discharged from skilled [place], PT 03/05/2022, 11:19 AM

## 2022-03-17 ENCOUNTER — Ambulatory Visit: Payer: Medicaid Other

## 2022-03-25 ENCOUNTER — Other Ambulatory Visit (HOSPITAL_COMMUNITY)
Admission: RE | Admit: 2022-03-25 | Discharge: 2022-03-25 | Disposition: A | Discharge status: Home / Self Care | Payer: Medicaid Other | Source: Ambulatory Visit | Attending: Adult Health | Admitting: Adult Health

## 2022-03-25 ENCOUNTER — Ambulatory Visit: Payer: Medicaid Other | Admitting: Adult Health

## 2022-03-25 ENCOUNTER — Encounter: Payer: Self-pay | Admitting: Adult Health

## 2022-03-25 VITALS — BP 128/73 | HR 93 | Ht 64.5 in | Wt 204.0 lb

## 2022-03-25 DIAGNOSIS — Z113 Encounter for screening for infections with a predominantly sexual mode of transmission: Secondary | ICD-10-CM | POA: Insufficient documentation

## 2022-03-25 DIAGNOSIS — Z6834 Body mass index (BMI) 34.0-34.9, adult: Secondary | ICD-10-CM | POA: Diagnosis not present

## 2022-03-25 DIAGNOSIS — Z713 Dietary counseling and surveillance: Secondary | ICD-10-CM

## 2022-03-25 DIAGNOSIS — S20461A Insect bite (nonvenomous) of right back wall of thorax, initial encounter: Secondary | ICD-10-CM

## 2022-03-25 DIAGNOSIS — N898 Other specified noninflammatory disorders of vagina: Secondary | ICD-10-CM | POA: Diagnosis present

## 2022-03-25 DIAGNOSIS — W57XXXA Bitten or stung by nonvenomous insect and other nonvenomous arthropods, initial encounter: Secondary | ICD-10-CM

## 2022-03-25 MED ORDER — DOXYCYCLINE HYCLATE 100 MG PO TABS: 100.0000 mg | ORAL_TABLET | Freq: Two times a day (BID) | ORAL | 0 refills | Status: DC

## 2022-03-25 MED ORDER — FLUCONAZOLE 150 MG PO TABS: ORAL_TABLET | ORAL | 1 refills | Status: DC

## 2022-03-25 MED ORDER — VALACYCLOVIR HCL 500 MG PO TABS: ORAL_TABLET | ORAL | 12 refills | Status: DC

## 2022-03-25 MED ORDER — PHENTERMINE HCL 37.5 MG PO TABS
37.5000 mg | ORAL_TABLET | Freq: Every day | ORAL | 0 refills | Status: DC
Start: 2022-03-25 — End: 2023-02-25

## 2022-03-25 NOTE — Progress Notes (Signed)
Subjective:     Patient ID: Sydney Mccall, female   DOB: 1985/10/20, 36 y.o.   MRN: [id number]  HPI Sydney Mccall is a 36 year old black female,single, [phone] in wanting help with loss, has tick bite and wants vaginal swab has had discharge and irritation but better today.  I last saw her in March and referred to [facility] for headaches had MR and went to PT but has not followed up. Headaches better but had partial empty sella on MR.  Lab Results  Component Value Date   DIAGPAP  08/13/2020    - Negative for intraepithelial lesion or malignancy (NILM)   HPV NOT DETECTED 04/01/2017   [place] Negative 08/13/2020     Review of Systems Has vaginal discharge and irritation Has tick bite Needs help with weight loss Reviewed past medical,surgical, social and family history. Reviewed medications and allergies.     Objective:   Physical Exam BP 128/73 (BP Location: Right Arm, Patient Position: Sitting, Cuff Size: Normal)   Pulse 93   Ht 5' 4.5" (1.638 m)   Wt 204 lb (92.5 kg)   LMP 03/16/2022   BMI 34.48 kg/m    Lungs: clear to ausculation bilaterally. Cardiovascular: regular rate and rhythm. Has red raised area right scapula where tick removed about 2-3 weeks ago   Skin warm and dry.Pelvic: external genitalia is normal in appearance no lesions, vagina: white discharge without odor,urethra has no lesions or masses noted, cervix:smooth and bulbous, uterus: normal size, shape and contour, non tender, no masses felt, adnexa: no masses or tenderness noted. Bladder is non tender and no masses felt. CV swab obtained.  [facility] - 03/25/22 1206       Pregnancy Intention Screening   Does the patient want to become pregnant in the next year? No    Does the patient's partner want to become pregnant in the next year? No    Would the patient like to discuss contraceptive options today? No      Contraception Wrap Up   Current Method Female Sterilization    End Method Female Sterilization    Contraception  Counseling Provided No            Examination chaperoned by Celene Squibb LPN   Assessment:     1. Weight loss counseling, encounter for Having trouble losing weight Will rx phentermine Increase water, decrease calories Increase activity  Meds ordered this encounter  Medications   valACYclovir (VALTREX) 500 MG tablet    Sig: TAKE ONE (1) TABLET BY MOUTH EVERY DAY    Dispense:  30 tablet    Refill:  12    Order Specific Question:   Supervising Provider    Answer:   Tania Ade H [[id number]]   phentermine (ADIPEX-P) 37.5 MG tablet    Sig: Take 1 tablet (37.5 mg total) by mouth daily before breakfast.    Dispense:  30 tablet    Refill:  0    Order Specific Question:   Supervising Provider    Answer:   Tania Ade H [[id number]]   doxycycline (VIBRA-TABS) 100 MG tablet    Sig: Take 1 tablet (100 mg total) by mouth 2 (two) times daily.    Dispense:  20 tablet    Refill:  0    Order Specific Question:   Supervising Provider    Answer:   Elonda Husky, LUTHER H [[id number]]   fluconazole (DIFLUCAN) 150 MG tablet    Sig: Take 1 now and 1 in 3 days  Dispense:  2 tablet    Refill:  1    Order Specific Question:   Supervising Provider    Answer:   Elonda Husky, LUTHER H [[id number]]    2. Body mass index 34.0-34.9, adult   3. Tick bite of right back wall of thorax, initial encounter Has red raised area right scapula, itchy Will rx doxycycline  Will also rx diflucan for yeast prevention   4. Screening examination for STD (sexually transmitted disease) CV swab sent for GC/CHL,trich,BV and yeast - Cervicovaginal ancillary only( [place])  5. Vaginal discharge CV swab sent - Cervicovaginal ancillary only( [place])  6. Vaginal irritation CV swab sent - Cervicovaginal ancillary only( [place])     Plan:    Call [facility] in follow up  Follow up in 4 weeks for weight and BP check

## 2022-03-27 LAB — CERVICOVAGINAL ANCILLARY ONLY
Bacterial Vaginitis (gardnerella): POSITIVE — AB
Candida Glabrata: NEGATIVE
Candida Vaginitis: NEGATIVE
Chlamydia: NEGATIVE
Comment: NEGATIVE
Comment: NEGATIVE
Comment: NEGATIVE
Comment: NEGATIVE
Comment: NEGATIVE
Comment: NORMAL
Neisseria Gonorrhea: NEGATIVE
Trichomonas: NEGATIVE

## 2022-03-28 ENCOUNTER — Telehealth: Payer: Self-pay | Admitting: Adult Health

## 2022-03-28 ENCOUNTER — Other Ambulatory Visit: Payer: Self-pay | Admitting: Adult Health

## 2022-03-28 MED ORDER — METRONIDAZOLE 500 MG PO TABS: 500.0000 mg | ORAL_TABLET | Freq: Two times a day (BID) | ORAL | 0 refills | Status: DC

## 2022-04-22 ENCOUNTER — Ambulatory Visit: Payer: Medicaid Other | Admitting: Adult Health

## 2022-09-05 ENCOUNTER — Other Ambulatory Visit: Payer: Self-pay | Admitting: Adult Health

## 2022-10-09 ENCOUNTER — Other Ambulatory Visit: Payer: Self-pay | Admitting: Adult Health

## 2022-11-05 ENCOUNTER — Telehealth: Payer: Self-pay | Admitting: Adult Health

## 2022-11-05 NOTE — Telephone Encounter (Signed)
Pt is requesting a  call

## 2023-02-25 ENCOUNTER — Ambulatory Visit (INDEPENDENT_AMBULATORY_CARE_PROVIDER_SITE_OTHER): Payer: BC Managed Care – PPO | Admitting: Adult Health

## 2023-02-25 ENCOUNTER — Encounter: Payer: Self-pay | Admitting: Adult Health

## 2023-02-25 VITALS — BP 130/79 | HR 112 | Ht 63.0 in | Wt 202.0 lb

## 2023-02-25 DIAGNOSIS — Z713 Dietary counseling and surveillance: Secondary | ICD-10-CM | POA: Diagnosis not present

## 2023-02-25 DIAGNOSIS — Z6835 Body mass index (BMI) 35.0-35.9, adult: Secondary | ICD-10-CM | POA: Diagnosis not present

## 2023-02-25 MED ORDER — VALACYCLOVIR HCL 500 MG PO TABS: ORAL_TABLET | ORAL | 12 refills | Status: DC

## 2023-02-25 MED ORDER — PHENTERMINE HCL 37.5 MG PO TABS: 37.5000 mg | ORAL_TABLET | Freq: Every day | ORAL | 0 refills | Status: DC

## 2023-02-25 NOTE — Progress Notes (Signed)
  Subjective:     Patient ID: Sydney Mccall, female   DOB: 1986-05-16, 37 y.o.   MRN: [id number]  HPI Sydney Mccall is a 37 year old black female,single, [id number] in to discuss getting on phentermine.   Last pap was negative HPV, NILM 08/13/20.  Review of Systems Has gained weight Reviewed past medical,surgical, social and family history. Reviewed medications and allergies.     Objective:   Physical Exam BP 130/79 (BP Location: Left Arm, Patient Position: Sitting, Cuff Size: Normal)   Pulse (!) 112   Ht 5\' 3"  (1.6 m)   Wt 202 lb (91.6 kg)   LMP 02/11/2023   BMI 35.78 kg/m     Skin warm and dry.  Lungs: clear to ausculation bilaterally. Cardiovascular: regular rate and rhythm.  Fall risk is low  [facility] - 02/25/23 1636       Pregnancy Intention Screening   Does the patient want to become pregnant in the next year? No    Does the patient's partner want to become pregnant in the next year? No    Would the patient like to discuss contraceptive options today? No      Contraception Wrap Up   Current Method Female Sterilization    End Method Female Sterilization             Assessment:     1. Weight loss counseling, encounter for Will rx phentermine Walk 60 minutes every day Cut carbs Meds ordered this encounter  Medications   phentermine (ADIPEX-P) 37.5 MG tablet    Sig: Take 1 tablet (37.5 mg total) by mouth daily before breakfast. Take 1 daily    Dispense:  30 tablet    Refill:  0    Order Specific Question:   Supervising Provider    Answer:   Despina Hidden, LUTHER H [[id number]]     2. Body mass index 35.0-35.9, adult     Plan:    Refilled valtrex at her request  Follow up in 4 weeks can be telehealth, will need BP and weight

## 2023-03-25 ENCOUNTER — Ambulatory Visit: Payer: BC Managed Care – PPO | Admitting: Adult Health

## 2023-03-25 ENCOUNTER — Encounter: Payer: Self-pay | Admitting: Adult Health

## 2023-03-25 VITALS — Ht 63.0 in | Wt 191.0 lb

## 2023-03-25 DIAGNOSIS — Z713 Dietary counseling and surveillance: Secondary | ICD-10-CM | POA: Diagnosis not present

## 2023-03-25 DIAGNOSIS — Z6833 Body mass index (BMI) 33.0-33.9, adult: Secondary | ICD-10-CM

## 2023-03-25 MED ORDER — PHENTERMINE HCL 37.5 MG PO TABS: 37.5000 mg | ORAL_TABLET | Freq: Every day | ORAL | 0 refills | Status: DC

## 2023-03-25 NOTE — Progress Notes (Signed)
Patient ID: Sydney Mccall, female   DOB: June 08, 1986, 37 y.o.   MRN: [id number]   TELEHEALTH GYNECOLOGY VISIT ENCOUNTER NOTE  [facility]: [facility]   Patient location: work  I connected with Sydney Mccall on 03/25/23 at  2:10 PM EDT by telephone and verified that I am speaking with the correct person using two identifiers. Patient was unable to do MyChart audiovisual encounter due to technical difficulties, she tried several times.    I discussed the limitations, risks, security and privacy concerns of performing an evaluation and management service by telephone and the availability of in person appointments. I also discussed with the patient that there may be a patient responsible charge related to this service. The patient expressed understanding and agreed to proceed.   History:  Sydney Mccall is a 37 y.o. [phone] female being evaluated today for weight loss after starting phentermine. She denies any headaches or other concerns.       Past Medical History:  Diagnosis Date   BV (bacterial vaginosis) 09/06/2014   Ectopic pregnancy 02/28/2014   Genital herpes    Headache    Herpes genitalia    HSV-2 infection complicating pregnancy 05/23/2013   On acyclovir 400mg  TID    NSVD (normal spontaneous vaginal delivery) 08/24/2013   Postpartum hemorrhage    history   Postpartum hemorrhage 07/13/2012   Pregnant    Vaginal itching 09/06/2014   Past Surgical History:  Procedure Laterality Date   BREAST CYST EXCISION     DIAGNOSTIC LAPAROSCOPY WITH REMOVAL OF ECTOPIC PREGNANCY Left 03/01/2014   Procedure: LAPAROSCOPIC REMOVAL OF LEFT ECTOPIC PREGNANCY;  Surgeon: Lazaro Arms, MD;  Location: [facility];  Service: Gynecology;  Laterality: Left;   LAPAROSCOPIC BILATERAL SALPINGECTOMY Bilateral 03/01/2014   Procedure: LAPAROSCOPIC BILATERAL SALPINGECTOMY;  Surgeon: Lazaro Arms, MD;  Location: [facility];  Service: Gynecology;  Laterality: Bilateral;    TUBAL LIGATION Bilateral 08/24/2013   Procedure: POST PARTUM TUBAL LIGATION;  Surgeon: Catalina Antigua, MD;  Location: [facility];  Service: Gynecology;  Laterality: Bilateral;   The following portions of the patient's history were reviewed and updated as appropriate: allergies, current medications, past family history, past medical history, past social history, past surgical history and problem list.   Health Maintenance:  Normal pap and negative HRHPV on 08/13/20.   Review of Systems:  Pertinent items noted in HPI and remainder of comprehensive ROS otherwise negative.  Physical Exam:   General:  Alert, oriented and cooperative.   Mental Status: Normal mood and affect perceived. Normal judgment and thought content.  Physical exam deferred due to nature of the encounter Ht 5\' 3"  (1.6 m)   Wt 191 lb (86.6 kg)   LMP 03/08/2023   BMI 33.83 kg/m  pt to call back with BP. Has lost 11 lbs.  Upstream - 03/25/23 1440       Pregnancy Intention Screening   Does the patient want to become pregnant in the next year? No    Does the patient's partner want to become pregnant in the next year? No    Would the patient like to discuss contraceptive options today? No      Contraception Wrap Up   Current Method Female Sterilization    End Method Female Sterilization             Labs and Imaging No results found for this or any previous visit (from the past 336 hour(s)). No results found.  Assessment and Plan:     1. Weight loss counseling, encounter for Continue weight loss efforts Will refill phentermine  Meds ordered this encounter  Medications   phentermine (ADIPEX-P) 37.5 MG tablet    Sig: Take 1 tablet (37.5 mg total) by mouth daily before breakfast. Take 1 daily    Dispense:  30 tablet    Refill:  0    Order Specific Question:   Supervising Provider    Answer:   Despina Hidden, LUTHER H [[id number]]    2. Body mass index 33.0-33.9, adult     Follow up in 4 weeks, can be tele visit   I  discussed the assessment and treatment plan with the patient. The patient was provided an opportunity to ask questions and all were answered. The patient agreed with the plan and demonstrated an understanding of the instructions.   The patient was advised to call back or seek an in-person evaluation/go to the ED if the symptoms worsen or if the condition fails to improve as anticipated.  I provided 8 minutes of non-face-to-face time during this encounter.   Cyril Mourning, NP [facility] [employer], [facility]

## 2023-04-14 ENCOUNTER — Telehealth: Payer: Self-pay | Admitting: Adult Health

## 2023-04-14 NOTE — Telephone Encounter (Signed)
Pt states she would like a call back from [place].

## 2023-04-14 NOTE — Telephone Encounter (Signed)
Pt's BP was 125/80. Has bumps on chest that itches. Has noticed it 3 days. Call transferred to [facility] for appt. Needs appt around 7/17 for follow up on weight & BP. JSY

## 2023-04-21 ENCOUNTER — Ambulatory Visit: Payer: BC Managed Care – PPO | Admitting: Adult Health

## 2023-04-21 ENCOUNTER — Telehealth: Payer: Self-pay | Admitting: Adult Health

## 2023-04-21 ENCOUNTER — Other Ambulatory Visit (HOSPITAL_COMMUNITY)
Admission: RE | Admit: 2023-04-21 | Discharge: 2023-04-21 | Disposition: A | Discharge status: Home / Self Care | Payer: BC Managed Care – PPO | Source: Ambulatory Visit | Attending: Adult Health | Admitting: Adult Health

## 2023-04-21 ENCOUNTER — Encounter: Payer: Self-pay | Admitting: Adult Health

## 2023-04-21 VITALS — BP 144/84 | HR 103 | Ht 63.0 in | Wt 200.5 lb

## 2023-04-21 DIAGNOSIS — B9689 Other specified bacterial agents as the cause of diseases classified elsewhere: Secondary | ICD-10-CM | POA: Insufficient documentation

## 2023-04-21 DIAGNOSIS — R102 Pelvic and perineal pain: Secondary | ICD-10-CM | POA: Diagnosis not present

## 2023-04-21 DIAGNOSIS — Z113 Encounter for screening for infections with a predominantly sexual mode of transmission: Secondary | ICD-10-CM

## 2023-04-21 DIAGNOSIS — Z6835 Body mass index (BMI) 35.0-35.9, adult: Secondary | ICD-10-CM

## 2023-04-21 DIAGNOSIS — N760 Acute vaginitis: Secondary | ICD-10-CM | POA: Diagnosis not present

## 2023-04-21 DIAGNOSIS — N898 Other specified noninflammatory disorders of vagina: Secondary | ICD-10-CM

## 2023-04-21 DIAGNOSIS — Z713 Dietary counseling and surveillance: Secondary | ICD-10-CM | POA: Diagnosis not present

## 2023-04-21 DIAGNOSIS — B3731 Acute candidiasis of vulva and vagina: Secondary | ICD-10-CM | POA: Insufficient documentation

## 2023-04-21 MED ORDER — PHENTERMINE HCL 37.5 MG PO TABS: 37.5000 mg | ORAL_TABLET | Freq: Every day | ORAL | 0 refills | Status: DC

## 2023-04-21 NOTE — Telephone Encounter (Signed)
Pt has a vaginal discharge X 3 days. Low abd pain. JAG had a 1:30 appt available. Pt to come in then. JSY

## 2023-04-21 NOTE — Progress Notes (Signed)
  Subjective:     Patient ID: Sydney Mccall, female   DOB: 1986/01/06, 37 y.o.   MRN: [id number]  HPI Sydney Mccall is a 37 year old black female,single, [id number] in complaining of vaginal discharge and low pelvic pain. Wants STD testing he cheated. Had appt tomorrow for weight and BP, will cancel that.      Component Value Date/Time   DIAGPAP  08/13/2020 1445    - Negative for intraepithelial lesion or malignancy (NILM)   DIAGPAP  04/01/2017 0000    NEGATIVE FOR INTRAEPITHELIAL LESIONS OR MALIGNANCY.   HPVHIGH Negative 08/13/2020 1445   ADEQPAP  08/13/2020 1445    Satisfactory for evaluation; transformation zone component PRESENT.   ADEQPAP  04/01/2017 0000    Satisfactory for evaluation  endocervical/transformation zone component PRESENT.    Review of Systems +vaginal discharge +pelvic pain Reviewed past medical,surgical, social and family history. Reviewed medications and allergies.     Objective:   Physical Exam BP (!) 144/84 (BP Location: Right Arm, Patient Position: Sitting, Cuff Size: Normal)   Pulse (!) 103   Ht 5\' 3"  (1.6 m)   Wt 200 lb 8 oz (90.9 kg)   LMP 04/02/2023   BMI 35.52 kg/m   Skin warm and dry.Pelvic: external genitalia is normal in appearance no lesions, vagina: white discharge with slight odor,urethra has no lesions or masses noted, cervix:smooth and bulbous, uterus: normal size, shape and contour, non tender, no masses felt, adnexa: no masses or tenderness noted. Bladder is non tender and no masses felt.CV swab obtained from vagina, rectal swab obtained.    Fall risk is low  Upstream - 04/21/23 1342       Pregnancy Intention Screening   Does the patient want to become pregnant in the next year? No    Does the patient's partner want to become pregnant in the next year? No    Would the patient like to discuss contraceptive options today? No      Contraception Wrap Up   Current Method Female Sterilization    End Method Female Sterilization    Contraception  Counseling Provided No            Examination chaperoned by Malachy Mood LPN  Assessment:     1. Vaginal discharge CV swab sent for GC/CHL,trich and BV, yeast - Cervicovaginal ancillary only( [place])  2. Screening examination for STD (sexually transmitted disease) Check HIV and RPR Check vaginal swab for GC/CHL,trich and BV,yeast Rectal swab sent for GC/CHL - Cervicovaginal ancillary only( [place]) - HIV Antibody (routine testing w rflx) - RPR - Cervicovaginal ancillary only( [place])  3. Pelvic pain CV swab sent - Cervicovaginal ancillary only( [place])  4. Weight loss counseling, encounter for Has gained weight, stopped phentermine, stressed  Will resume phentermine Meds ordered this encounter  Medications   phentermine (ADIPEX-P) 37.5 MG tablet    Sig: Take 1 tablet (37.5 mg total) by mouth daily before breakfast. Take 1 daily    Dispense:  30 tablet    Refill:  0    Order Specific Question:   Supervising Provider    Answer:   EURE, LUTHER H [[id number]]     5. Body mass index 35.0-35.9, adult      Plan:     Follow up in 4 weeks for weight and BP check if starts back on phentermine now.

## 2023-04-21 NOTE — Telephone Encounter (Signed)
Pt is requesting a telephone call

## 2023-04-22 ENCOUNTER — Telehealth: Payer: Medicaid Other | Admitting: Adult Health

## 2023-04-22 ENCOUNTER — Telehealth: Payer: Self-pay | Admitting: *Deleted

## 2023-04-22 LAB — HIV ANTIBODY (ROUTINE TESTING W REFLEX): HIV Screen 4th Generation wRfx: NONREACTIVE

## 2023-04-22 LAB — RPR: RPR Ser Ql: NONREACTIVE

## 2023-04-22 NOTE — Telephone Encounter (Signed)
-----   Message from [facility] sent at 04/22/2023  8:45 AM EDT ----- Let pt know non reactive RPR and HIV. THX

## 2023-04-22 NOTE — Telephone Encounter (Signed)
Pt aware HIV & RPR are non reactive which is negative. Pt voiced understanding. JSY

## 2023-04-23 ENCOUNTER — Other Ambulatory Visit: Payer: Self-pay | Admitting: Adult Health

## 2023-04-23 ENCOUNTER — Telehealth: Payer: Self-pay | Admitting: *Deleted

## 2023-04-23 LAB — CERVICOVAGINAL ANCILLARY ONLY
Bacterial Vaginitis (gardnerella): POSITIVE — AB
Candida Glabrata: POSITIVE — AB
Candida Vaginitis: POSITIVE — AB
Chlamydia: NEGATIVE
Chlamydia: NEGATIVE
Comment: NEGATIVE
Comment: NEGATIVE
Comment: NEGATIVE
Comment: NEGATIVE
Comment: NEGATIVE
Comment: NORMAL
Comment: NEGATIVE
Comment: NORMAL
Neisseria Gonorrhea: NEGATIVE
Neisseria Gonorrhea: NEGATIVE
Trichomonas: NEGATIVE

## 2023-04-23 MED ORDER — METRONIDAZOLE 500 MG PO TABS: 500.0000 mg | ORAL_TABLET | Freq: Two times a day (BID) | ORAL | 0 refills | Status: DC

## 2023-04-23 MED ORDER — FLUCONAZOLE 100 MG PO TABS: 100.0000 mg | ORAL_TABLET | Freq: Every day | ORAL | 0 refills | Status: DC

## 2023-04-23 NOTE — Telephone Encounter (Signed)
Pt aware + for BV and yeast. Flagyl & Diflucan sent to pharmacy. No sex or alcohol while taking meds. Pt voiced understanding. JSY

## 2023-04-23 NOTE — Telephone Encounter (Signed)
-----   Message from Cyril Mourning sent at 04/23/2023  3:41 PM EDT ----- Let her know +BV and yeast and rx sent for flagyl and diflucan

## 2023-04-23 NOTE — Progress Notes (Signed)
+  BV and yeast on vaginal swab, will rx flagyl and diflucan, no sex or alcohol while taking  

## 2023-06-10 ENCOUNTER — Ambulatory Visit: Payer: Medicaid Other | Admitting: Adult Health

## 2023-07-29 ENCOUNTER — Telehealth: Payer: Self-pay | Admitting: *Deleted

## 2023-07-29 NOTE — Telephone Encounter (Signed)
Pt states Jenn wanted pt to call in with BP and weight so pt can get refill on Phentermine. Weight today was 182 lb BP 125/73. Thanks! JSY

## 2023-07-30 MED ORDER — PHENTERMINE HCL 37.5 MG PO TABS: 37.5000 mg | ORAL_TABLET | Freq: Every day | ORAL | 0 refills | Status: DC

## 2023-07-30 NOTE — Telephone Encounter (Signed)
Weight 182 lbs and BP 125/73 will refill phentermine

## 2023-09-02 ENCOUNTER — Other Ambulatory Visit: Payer: Self-pay | Admitting: Adult Health

## 2023-11-25 ENCOUNTER — Telehealth: Payer: Self-pay | Admitting: Adult Health

## 2023-11-25 ENCOUNTER — Other Ambulatory Visit: Payer: Self-pay | Admitting: Adult Health

## 2023-11-25 NOTE — Telephone Encounter (Signed)
Pt is requesting a call from [place] about medication.

## 2023-11-25 NOTE — Telephone Encounter (Signed)
Pt wants to get back on phentermine, has made appt

## 2023-11-30 ENCOUNTER — Other Ambulatory Visit: Payer: Self-pay | Admitting: Adult Health

## 2023-12-01 ENCOUNTER — Other Ambulatory Visit (HOSPITAL_COMMUNITY)
Admission: RE | Admit: 2023-12-01 | Discharge: 2023-12-01 | Disposition: A | Discharge status: Home / Self Care | Payer: BC Managed Care – PPO | Source: Ambulatory Visit | Attending: Adult Health | Admitting: Adult Health

## 2023-12-01 ENCOUNTER — Ambulatory Visit: Payer: BC Managed Care – PPO | Admitting: Adult Health

## 2023-12-01 ENCOUNTER — Encounter: Payer: Self-pay | Admitting: Adult Health

## 2023-12-01 VITALS — BP 135/75 | HR 107 | Ht 63.0 in | Wt 189.0 lb

## 2023-12-01 DIAGNOSIS — Z01419 Encounter for gynecological examination (general) (routine) without abnormal findings: Secondary | ICD-10-CM | POA: Insufficient documentation

## 2023-12-01 DIAGNOSIS — Z6833 Body mass index (BMI) 33.0-33.9, adult: Secondary | ICD-10-CM

## 2023-12-01 DIAGNOSIS — Z713 Dietary counseling and surveillance: Secondary | ICD-10-CM

## 2023-12-01 DIAGNOSIS — Z13220 Encounter for screening for lipoid disorders: Secondary | ICD-10-CM | POA: Diagnosis not present

## 2023-12-01 MED ORDER — PHENTERMINE HCL 37.5 MG PO TABS: 37.5000 mg | ORAL_TABLET | Freq: Every day | ORAL | 0 refills | Status: DC

## 2023-12-01 NOTE — Progress Notes (Signed)
 Patient ID: Sydney Mccall, female   DOB: 1986/04/04, 38 y.o.   MRN: [id number] History of Present Illness: Railee is a 38 year old black female,single, [phone] in wanting to get back on phentermine for weight loss and needs a pap and physical. Has form for work to be signed.   Current Medications, Allergies, Past Medical History, Past Surgical History, Family History and Social History were reviewed in [employer] record.     Review of Systems: Patient denies any headaches, hearing loss, fatigue, blurred vision, shortness of breath, chest pain, abdominal pain, problems with bowel movements, urination, or intercourse. No joint pain or mood swings.     Physical Exam:BP 135/75 (BP Location: Left Arm, Patient Position: Sitting, Cuff Size: Normal)   Pulse (!) 107   Ht 5\' 3"  (1.6 m)   Wt 189 lb (85.7 kg)   LMP 11/16/2023 (Exact Date)   BMI 33.48 kg/m   General:  Well developed, well nourished, no acute distress Skin:  Warm and dry Neck:  Midline trachea, normal thyroid, good ROM, no lymphadenopathy Lungs; Clear to auscultation bilaterally Breast:  No dominant palpable mass, retraction, or nipple discharge Cardiovascular: Regular rate and rhythm Abdomen:  Soft, non tender, no hepatosplenomegaly Pelvic:  External genitalia is normal in appearance, no lesions.  The vagina is normal in appearance. Urethra has no lesions or masses. The cervix is bulbous. Pap with GC/CHL and HR HPV genotyping performed.   Uterus is felt to be normal size, shape, and contour.  No adnexal masses or tenderness noted.Bladder is non tender, no masses felt. Extremities/musculoskeletal:  No swelling or varicosities noted, no clubbing or cyanosis Psych:  No mood changes, alert and cooperative,seems happy Fall risk is low  Upstream - 12/01/23 1601       Pregnancy Intention Screening   Does the patient want to become pregnant in the next year? No    Does the patient's partner want to become  pregnant in the next year? No    Would the patient like to discuss contraceptive options today? No      Contraception Wrap Up   Current Method Female Sterilization    End Method Female Sterilization    Contraception Counseling Provided No            Examination chaperoned by Malachy Mood LPN  Impression and plan: 1. Encounter for gynecological examination with Papanicolaou smear of cervix (Primary) Pap sent Pap in 3 years if normal Physical in 1 year  Will check labs  - Cytology - PAP( [place]) - CBC - Comprehensive metabolic panel - Lipid panel  2. Weight loss counseling, encounter for Try to decrease carbs and sweets Exercise more Will rx phentermine Meds ordered this encounter  Medications   phentermine (ADIPEX-P) 37.5 MG tablet    Sig: Take 1 tablet (37.5 mg total) by mouth daily before breakfast. Take 1 daily    Dispense:  30 tablet    Refill:  0    Supervising Provider:   Duane Lope H [[id number]]   Can do telehealth visit in 4 weeks, will need BP and weight   3. Body mass index 33.0-33.9, adult  4. Screening cholesterol level - Lipid panel

## 2023-12-02 ENCOUNTER — Telehealth: Payer: Self-pay | Admitting: *Deleted

## 2023-12-02 ENCOUNTER — Encounter: Payer: Self-pay | Admitting: Adult Health

## 2023-12-02 DIAGNOSIS — D649 Anemia, unspecified: Secondary | ICD-10-CM

## 2023-12-02 DIAGNOSIS — D509 Iron deficiency anemia, unspecified: Secondary | ICD-10-CM | POA: Insufficient documentation

## 2023-12-02 LAB — COMPREHENSIVE METABOLIC PANEL
ALT: 9 [IU]/L (ref 0–32)
AST: 16 [IU]/L (ref 0–40)
Albumin: 4.1 g/dL (ref 3.9–4.9)
Alkaline Phosphatase: 80 [IU]/L (ref 44–121)
BUN/Creatinine Ratio: 10 (ref 9–23)
BUN: 8 mg/dL (ref 6–20)
Bilirubin Total: 0.2 mg/dL (ref 0.0–1.2)
CO2: 21 mmol/L (ref 20–29)
Calcium: 9.1 mg/dL (ref 8.7–10.2)
Chloride: 102 mmol/L (ref 96–106)
Creatinine, Ser: 0.82 mg/dL (ref 0.57–1.00)
Globulin, Total: 3.7 g/dL (ref 1.5–4.5)
Glucose: 90 mg/dL (ref 70–99)
Potassium: 4.5 mmol/L (ref 3.5–5.2)
Sodium: 139 mmol/L (ref 134–144)
Total Protein: 7.8 g/dL (ref 6.0–8.5)
eGFR: 94 mL/min/{1.73_m2} (ref >59)

## 2023-12-02 LAB — LIPID PANEL
Chol/HDL Ratio: 2.7 {ratio} (ref 0.0–4.4)
Cholesterol, Total: 161 mg/dL (ref 100–199)
HDL: 60 mg/dL (ref >39)
LDL Chol Calc (NIH): 86 mg/dL (ref 0–99)
Triglycerides: 81 mg/dL (ref 0–149)
VLDL Cholesterol Cal: 15 mg/dL (ref 5–40)

## 2023-12-02 LAB — CBC
Hematocrit: 26.5 % — ABNORMAL LOW (ref 34.0–46.6)
Hemoglobin: 7.4 g/dL — ABNORMAL LOW (ref 11.1–15.9)
MCH: 19.7 pg — ABNORMAL LOW (ref 26.6–33.0)
MCHC: 27.9 g/dL — ABNORMAL LOW (ref 31.5–35.7)
MCV: 71 fL — ABNORMAL LOW (ref 79–97)
Platelets: 306 10*3/uL (ref 150–450)
RBC: 3.76 x10E6/uL — ABNORMAL LOW (ref 3.77–5.28)
RDW: 18.2 % — ABNORMAL HIGH (ref 11.7–15.4)
WBC: 5.6 10*3/uL (ref 3.4–10.8)

## 2023-12-02 NOTE — Telephone Encounter (Signed)
-----   Message from [place] sent at 12/02/2023  1:41 PM EST ----- Call her this afternoon, later and let her know about labs, and taking iron

## 2023-12-02 NOTE — Telephone Encounter (Signed)
 Referred to hematology

## 2023-12-02 NOTE — Telephone Encounter (Signed)
 Pt aware of lab results. Cholesterol is good. Blood sugar, liver and kidney function is normal. Hemoglobin and RBC is low. Pt hasn't had any heavy periods or rectal bleeding. Pt has never seen hematology. Pt advised to take OTC Iron 325 mg daily and eat iron rich foods, like red meat, beans and green leafy veg. Pt voiced understanding. JSY

## 2023-12-03 LAB — CYTOLOGY - PAP
Chlamydia: NEGATIVE
Comment: NEGATIVE
Comment: NEGATIVE
Comment: NORMAL
Diagnosis: NEGATIVE
High risk HPV: NEGATIVE
Neisseria Gonorrhea: NEGATIVE

## 2023-12-04 ENCOUNTER — Telehealth: Payer: Self-pay | Admitting: *Deleted

## 2023-12-04 ENCOUNTER — Other Ambulatory Visit: Payer: Self-pay | Admitting: Adult Health

## 2023-12-04 MED ORDER — METRONIDAZOLE 500 MG PO TABS: 500.0000 mg | ORAL_TABLET | Freq: Two times a day (BID) | ORAL | 0 refills | Status: DC

## 2023-12-04 NOTE — Telephone Encounter (Signed)
-----   Message from [place] sent at 12/04/2023 10:09 AM EST ----- Can you call her about her pap and BV THX

## 2023-12-04 NOTE — Telephone Encounter (Signed)
 Voice mail not set up @ 12:08 pm. JSY

## 2023-12-07 NOTE — Telephone Encounter (Signed)
 Pt aware pap was negative for malignancy, HPV and GC/CHL but was + for BV. Flagyl was sent to pharmacy. No sex or alcohol while taking med. Pt voiced understanding. JSY

## 2023-12-09 ENCOUNTER — Inpatient Hospital Stay: Payer: BC Managed Care – PPO | Attending: Oncology | Admitting: Oncology

## 2023-12-09 ENCOUNTER — Inpatient Hospital Stay

## 2023-12-09 ENCOUNTER — Inpatient Hospital Stay: Payer: BC Managed Care – PPO

## 2023-12-09 VITALS — BP 130/75 | HR 106 | Temp 99.1°F | Resp 19 | Ht 63.0 in | Wt 187.6 lb

## 2023-12-09 DIAGNOSIS — D649 Anemia, unspecified: Secondary | ICD-10-CM

## 2023-12-09 DIAGNOSIS — D509 Iron deficiency anemia, unspecified: Secondary | ICD-10-CM | POA: Insufficient documentation

## 2023-12-09 LAB — COMPREHENSIVE METABOLIC PANEL
ALT: 11 U/L (ref 0–44)
AST: 19 U/L (ref 15–41)
Albumin: 3.7 g/dL (ref 3.5–5.0)
Alkaline Phosphatase: 58 U/L (ref 38–126)
Anion gap: 7 (ref 5–15)
BUN: 9 mg/dL (ref 6–20)
CO2: 22 mmol/L (ref 22–32)
Calcium: 9.1 mg/dL (ref 8.9–10.3)
Chloride: 107 mmol/L (ref 98–111)
Creatinine, Ser: 0.79 mg/dL (ref 0.44–1.00)
GFR, Estimated: >60 mL/min (ref >60)
Glucose, Bld: 90 mg/dL (ref 70–99)
Potassium: 3.7 mmol/L (ref 3.5–5.1)
Sodium: 136 mmol/L (ref 135–145)
Total Bilirubin: 0.4 mg/dL (ref 0.0–1.2)
Total Protein: 8.4 g/dL — ABNORMAL HIGH (ref 6.5–8.1)

## 2023-12-09 LAB — CBC WITH DIFFERENTIAL/PLATELET
Abs Immature Granulocytes: 0 10*3/uL (ref 0.00–0.07)
Basophils Absolute: 0.1 10*3/uL (ref 0.0–0.1)
Basophils Relative: 1 %
Eosinophils Absolute: 0.1 10*3/uL (ref 0.0–0.5)
Eosinophils Relative: 2 %
HCT: 26.3 % — ABNORMAL LOW (ref 36.0–46.0)
Hemoglobin: 7.3 g/dL — ABNORMAL LOW (ref 12.0–15.0)
Immature Granulocytes: 0 %
Lymphocytes Relative: 32 %
Lymphs Abs: 1.3 10*3/uL (ref 0.7–4.0)
MCH: 19.7 pg — ABNORMAL LOW (ref 26.0–34.0)
MCHC: 27.8 g/dL — ABNORMAL LOW (ref 30.0–36.0)
MCV: 70.9 fL — ABNORMAL LOW (ref 80.0–100.0)
Monocytes Absolute: 0.5 10*3/uL (ref 0.1–1.0)
Monocytes Relative: 13 %
Neutro Abs: 2.1 10*3/uL (ref 1.7–7.7)
Neutrophils Relative %: 52 %
Platelets: 148 10*3/uL — ABNORMAL LOW (ref 150–400)
RBC: 3.71 MIL/uL — ABNORMAL LOW (ref 3.87–5.11)
RDW: 19.2 % — ABNORMAL HIGH (ref 11.5–15.5)
WBC: 4.1 10*3/uL (ref 4.0–10.5)
nRBC: 0 % (ref 0.0–0.2)

## 2023-12-09 LAB — IRON AND TIBC
Iron: 26 ug/dL — ABNORMAL LOW (ref 28–170)
Saturation Ratios: 6 % — ABNORMAL LOW (ref 10.4–31.8)
TIBC: 472 ug/dL — ABNORMAL HIGH (ref 250–450)
UIBC: 446 ug/dL

## 2023-12-09 LAB — FERRITIN: Ferritin: 2 ng/mL — ABNORMAL LOW (ref 11–307)

## 2023-12-09 LAB — FOLATE: Folate: 12.9 ng/mL (ref >5.9)

## 2023-12-09 LAB — VITAMIN B12: Vitamin B-12: 530 pg/mL (ref 180–914)

## 2023-12-09 NOTE — Patient Instructions (Signed)
 VISIT SUMMARY:  Today, we discussed your chronic anemia and its potential causes, as well as your current treatment for weight loss. We have planned further tests and treatments to address your low iron levels and overall health.  YOUR PLAN:  -CHRONIC ANEMIA: Chronic anemia means that your body has a lower than normal number of red blood cells, which can cause fatigue and other symptoms. We will do blood work today to check your iron levels. If we confirm that you have low iron, we will plan for an iron infusion, which is a way to quickly increase your iron levels through an IV. We will also refer you to a gastroenterologist to check for any possible bleeding in your digestive system.  INSTRUCTIONS:  Please complete the blood work today as discussed. We will contact you to schedule the iron infusion if your tests confirm low iron levels. Additionally, we will refer you to a gastroenterologist for further evaluation. Follow up with [place] in 6 weeks for repeat blood work to assess your response to the iron infusion.  For more information, you can read your full clinical note, available in your patient portal.

## 2023-12-09 NOTE — Progress Notes (Signed)
 [facility] HEMATOLOGY NEW VISIT  Patient, No Pcp Per  REASON FOR REFERRAL: Microcytic anemia   HISTORY OF PRESENT ILLNESS: Sydney Mccall 38 y.o. female referred for microcytic anemia.  She reports that her iron levels have been low for many years. The patient was not aware of her anemia until recently and has not received any specific treatment for it. She mentions a history of hemorrhage after the birth of her second child in 2013, which required blood transfusions. The patient also reports that she does not have heavy menstrual bleeding, changing her tampon every two hours, which she considers normal. She denies any abnormal bleeding outside of her menstrual cycle and has not noticed any blood in her stools. The patient is currently taking phentermine for weight loss and has noticed a slight decrease in her weight. She denies any abdominal pain or acid reflux.she reports some fatigue but considers it is normal for her.  Denies dyspnea on exertion, dizziness.  Patient is a non-smoker, nonalcoholic, does not have any family history of colon cancer.  I have reviewed the past medical history, past surgical history, social history and family history with the patient   ALLERGIES:  is allergic to iodine.  MEDICATIONS:  Current Outpatient Medications  Medication Sig Dispense Refill   metroNIDAZOLE (FLAGYL) 500 MG tablet Take 1 tablet (500 mg total) by mouth 2 (two) times daily. 14 tablet 0   phentermine (ADIPEX-P) 37.5 MG tablet Take 1 tablet (37.5 mg total) by mouth daily before breakfast. Take 1 daily 30 tablet 0   valACYclovir (VALTREX) 500 MG tablet Take 1 daily 30 tablet 12   No current facility-administered medications for this visit.     REVIEW OF SYSTEMS:   Constitutional: Denies fevers, chills or night sweats Eyes: Denies blurriness of vision Ears, nose, mouth, throat, and face: Denies mucositis or sore throat Respiratory: Denies cough,  dyspnea or wheezes Cardiovascular: Denies palpitation, chest discomfort or lower extremity swelling Gastrointestinal:  Denies nausea, heartburn or change in bowel habits Skin: Denies abnormal skin rashes Lymphatics: Denies new lymphadenopathy or easy bruising Neurological:Denies numbness, tingling or new weaknesses Behavioral/Psych: Mood is stable, no new changes  All other systems were reviewed with the patient and are negative.  PHYSICAL EXAMINATION:   Vitals:   12/09/23 1317  BP: 130/75  Pulse: (!) 106  Resp: 19  Temp: 99.1 F (37.3 C)  SpO2: 100%    GENERAL:alert, no distress and comfortable LYMPH:  no palpable lymphadenopathy in the cervical, axillary or inguinal LUNGS: clear to auscultation and percussion with normal breathing effort HEART: regular rate & rhythm and no murmurs and no lower extremity edema ABDOMEN:abdomen soft, non-tender and normal bowel sounds Musculoskeletal:no cyanosis of digits and no clubbing  NEURO: alert & oriented x 3 with fluent speech  LABORATORY DATA:  I have reviewed the data as listed  Lab Results  Component Value Date   WBC 5.6 12/01/2023   NEUTROABS 2.1 01/15/2022   HGB 7.4 (L) 12/01/2023   HCT 26.5 (L) 12/01/2023   MCV 71 (L) 12/01/2023   PLT 306 12/01/2023       Chemistry      Component Value Date/Time   NA 139 12/01/2023 1634   K 4.5 12/01/2023 1634   CL 102 12/01/2023 1634   CO2 21 12/01/2023 1634   BUN 8 12/01/2023 1634   CREATININE 0.82 12/01/2023 1634      Component Value Date/Time   CALCIUM 9.1 12/01/2023 1634  ALKPHOS 80 12/01/2023 1634   AST 16 12/01/2023 1634   ALT 9 12/01/2023 1634   BILITOT 0.2 12/01/2023 1634      ASSESSMENT & PLAN:  Patient is a 38 year old female referred for microcytic anemia   Microcytic anemia Hemoglobin of 7.5 last week, with a history of anemia dating back to 2011.  No recent iron labs are available.  Patient does not report excessive menstrual bleeding or blood in stools.   Previous postpartum hemorrhage in 2013. -Will order blood work today for anemia workup -If iron deficiency is confirmed, will start oral iron supplementation and IV iron.  Discussed the risk versus benefits of IV iron in detail including the expected side effects. -Refer to gastroenterologist for further evaluation of potential GI bleeding source.  Return to clinic in 6 weeks with labs   Orders Placed This Encounter  Procedures   Ferritin    Standing Status:   Future    Number of Occurrences:   1    Expected Date:   12/09/2023    Expiration Date:   12/08/2024   Folate    Standing Status:   Future    Number of Occurrences:   1    Expected Date:   12/09/2023    Expiration Date:   12/08/2024   Vitamin B12    Standing Status:   Future    Number of Occurrences:   1    Expected Date:   12/09/2023    Expiration Date:   12/08/2024   CBC with Differential/Platelet    Standing Status:   Future    Number of Occurrences:   1    Expected Date:   12/09/2023    Expiration Date:   12/08/2024   Comprehensive metabolic panel    Standing Status:   Future    Number of Occurrences:   1    Expected Date:   12/09/2023    Expiration Date:   12/08/2024   Iron and TIBC    Standing Status:   Future    Number of Occurrences:   1    Expected Date:   12/09/2023    Expiration Date:   12/08/2024    The total time spent in the appointment was 40 minutes encounter with patients including review of chart and various tests results, discussions about plan of care and coordination of care plan   All questions were answered. The patient knows to call the clinic with any problems, questions or concerns. No barriers to learning was detected.   Cindie Crumbly, MD 3/5/20252:13 PM

## 2023-12-09 NOTE — Assessment & Plan Note (Addendum)
 Hemoglobin of 7.5 last week, with a history of anemia dating back to 2011.  No recent iron labs are available.  Patient does not report excessive menstrual bleeding or blood in stools.  Previous postpartum hemorrhage in 2013. -Will order blood work today for anemia workup -If iron deficiency is confirmed, will start oral iron supplementation and IV iron.  Discussed the risk versus benefits of IV iron in detail including the expected side effects. -Refer to gastroenterologist for further evaluation of potential GI bleeding source.  Return to clinic in 6 weeks with labs

## 2023-12-10 ENCOUNTER — Encounter: Payer: Self-pay | Admitting: Oncology

## 2023-12-10 NOTE — Addendum Note (Signed)
 Addended byCindie Crumbly on: 12/10/2023 09:21 AM   Modules accepted: Orders

## 2023-12-14 ENCOUNTER — Other Ambulatory Visit: Payer: Self-pay | Admitting: *Deleted

## 2023-12-14 NOTE — Progress Notes (Signed)
 Patient made aware to take ferrous sulfate 325 mg every other day.  Verbalized understanding.

## 2023-12-16 ENCOUNTER — Inpatient Hospital Stay

## 2023-12-16 VITALS — BP 119/75 | HR 99 | Temp 97.3°F | Resp 18

## 2023-12-16 DIAGNOSIS — D509 Iron deficiency anemia, unspecified: Secondary | ICD-10-CM

## 2023-12-16 MED ORDER — SODIUM CHLORIDE 0.9 % IV SOLN
510.0000 mg | Freq: Once | INTRAVENOUS | Status: AC
  Administered 2023-12-16: 510 mg via INTRAVENOUS
  Filled 2023-12-16: qty 17

## 2023-12-16 MED ORDER — CETIRIZINE HCL 10 MG PO TABS
10.0000 mg | ORAL_TABLET | Freq: Once | ORAL | Status: AC
  Administered 2023-12-16: 10 mg via ORAL
  Filled 2023-12-16: qty 1

## 2023-12-16 MED ORDER — ACETAMINOPHEN 325 MG PO TABS
650.0000 mg | ORAL_TABLET | Freq: Once | ORAL | Status: AC
  Administered 2023-12-16: 650 mg via ORAL
  Filled 2023-12-16: qty 2

## 2023-12-16 MED ORDER — SODIUM CHLORIDE 0.9 % IV SOLN: INTRAVENOUS | Status: DC

## 2023-12-16 NOTE — Progress Notes (Signed)
 Patient tolerated iron infusion with no complaints voiced.  Peripheral IV site clean and dry with good blood return noted before and after infusion.  Band aid applied.  VSS with discharge and left in satisfactory condition with no s/s of distress noted.

## 2023-12-16 NOTE — Patient Instructions (Signed)

## 2023-12-23 ENCOUNTER — Inpatient Hospital Stay

## 2023-12-29 ENCOUNTER — Encounter: Payer: Self-pay | Admitting: Adult Health

## 2023-12-29 ENCOUNTER — Ambulatory Visit: Payer: BC Managed Care – PPO | Admitting: Adult Health

## 2023-12-29 VITALS — BP 120/70 | Ht 63.0 in | Wt 185.0 lb

## 2023-12-29 DIAGNOSIS — Z6832 Body mass index (BMI) 32.0-32.9, adult: Secondary | ICD-10-CM

## 2023-12-29 DIAGNOSIS — E669 Obesity, unspecified: Secondary | ICD-10-CM | POA: Diagnosis not present

## 2023-12-29 DIAGNOSIS — Z713 Dietary counseling and surveillance: Secondary | ICD-10-CM

## 2023-12-29 MED ORDER — PHENTERMINE HCL 37.5 MG PO TABS: 37.5000 mg | ORAL_TABLET | Freq: Every day | ORAL | 0 refills | Status: DC

## 2023-12-29 NOTE — Progress Notes (Signed)
 Patient ID: Sydney Mccall, female   DOB: 1986-05-06, 38 y.o.   MRN: [id number]   TELEHEALTH GYNECOLOGY VISIT ENCOUNTER NOTE  [facility] location: [facility]   Patient location: work  I connected with Sydney Mccall on 12/29/23 at  2:10 PM EDT by telephone and verified that I am speaking with the correct person using two identifiers. Patient was unable to do MyChart audiovisual encounter due to technical difficulties, she tried several times.    I discussed the limitations, risks, security and privacy concerns of performing an evaluation and management service by telephone and the availability of in person appointments. I also discussed with the patient that there may be a patient responsible charge related to this service. The patient expressed understanding and agreed to proceed.   History:  Sydney Mccall is a 38 y.o. [phone] female being evaluated today for weight loss on phentermine, has lost about 4-5 lbs. She denies any  other concerns.       Past Medical History:  Diagnosis Date   BV (bacterial vaginosis) 09/06/2014   Ectopic pregnancy 02/28/2014   Genital herpes    Headache    Herpes genitalia    HSV-2 infection complicating pregnancy 05/23/2013   On acyclovir 400mg  TID    NSVD (normal spontaneous vaginal delivery) 08/24/2013   Postpartum hemorrhage    history   Postpartum hemorrhage 07/13/2012   Pregnant    Vaginal itching 09/06/2014   Past Surgical History:  Procedure Laterality Date   BREAST CYST EXCISION     DIAGNOSTIC LAPAROSCOPY WITH REMOVAL OF ECTOPIC PREGNANCY Left 03/01/2014   Procedure: LAPAROSCOPIC REMOVAL OF LEFT ECTOPIC PREGNANCY;  Surgeon: Lazaro Arms, MD;  Location: [facility];  Service: Gynecology;  Laterality: Left;   LAPAROSCOPIC BILATERAL SALPINGECTOMY Bilateral 03/01/2014   Procedure: LAPAROSCOPIC BILATERAL SALPINGECTOMY;  Surgeon: Lazaro Arms, MD;  Location: [facility];  Service: Gynecology;  Laterality: Bilateral;    TUBAL LIGATION Bilateral 08/24/2013   Procedure: POST PARTUM TUBAL LIGATION;  Surgeon: Catalina Antigua, MD;  Location: [facility];  Service: Gynecology;  Laterality: Bilateral;   The following portions of the patient's history were reviewed and updated as appropriate: allergies, current medications, past family history, past medical history, past social history, past surgical history and problem list.   Health Maintenance:  Normal pap and negative HRHPV on 12/01/23  Review of Systems:  Pertinent items noted in HPI and remainder of comprehensive ROS otherwise negative.  Physical Exam:   General:  Alert, oriented and cooperative.   Mental Status: Normal mood and affect perceived. Normal judgment and thought content.  Physical exam deferred due to nature of the encounter BP 120/70 (BP Location: Left Arm, Patient Position: Sitting, Cuff Size: Normal)   Ht 5\' 3"  (1.6 m)   Wt 185 lb (83.9 kg)   LMP 12/11/2023 (Exact Date)   BMI 32.77 kg/m     Labs and Imaging No results found for this or any previous visit (from the past 2 weeks). No results found.    Assessment and Plan:     1. Weight loss counseling, encounter for (Primary) Continue weight loss efforts Will refill phentermine 37.5 mg  1 daily  Meds ordered this encounter  Medications   phentermine (ADIPEX-P) 37.5 MG tablet    Sig: Take 1 tablet (37.5 mg total) by mouth daily before breakfast. Take 1 daily    Dispense:  30 tablet    Refill:  0    Supervising Provider:   Despina Hidden,  LUTHER H [[id number]]    2. Body mass index 32.0-32.9, adult     Follow up in 1 month for weight and BP can be telehealth visit    I discussed the assessment and treatment plan with the patient. The patient was provided an opportunity to ask questions and all were answered. The patient agreed with the plan and demonstrated an understanding of the instructions.   The patient was advised to call back or seek an in-person evaluation/go to the ED if the symptoms worsen or  if the condition fails to improve as anticipated.  I provided 10 minutes of non-face-to-face time during this encounter.   Cyril Mourning, NP [facility] [employer], [facility]

## 2024-01-12 ENCOUNTER — Other Ambulatory Visit: Payer: Self-pay

## 2024-01-12 DIAGNOSIS — D509 Iron deficiency anemia, unspecified: Secondary | ICD-10-CM

## 2024-01-12 DIAGNOSIS — D649 Anemia, unspecified: Secondary | ICD-10-CM

## 2024-01-13 ENCOUNTER — Inpatient Hospital Stay: Attending: Oncology

## 2024-01-20 ENCOUNTER — Inpatient Hospital Stay: Admitting: Oncology

## 2024-01-28 ENCOUNTER — Encounter: Payer: Self-pay | Admitting: Adult Health

## 2024-01-28 ENCOUNTER — Ambulatory Visit: Admitting: Adult Health

## 2024-01-28 VITALS — BP 115/75 | Ht 63.0 in | Wt 184.0 lb

## 2024-01-28 DIAGNOSIS — Z713 Dietary counseling and surveillance: Secondary | ICD-10-CM

## 2024-01-28 DIAGNOSIS — Z6832 Body mass index (BMI) 32.0-32.9, adult: Secondary | ICD-10-CM | POA: Diagnosis not present

## 2024-01-28 DIAGNOSIS — E669 Obesity, unspecified: Secondary | ICD-10-CM | POA: Diagnosis not present

## 2024-01-28 MED ORDER — PHENTERMINE HCL 37.5 MG PO TABS: 37.5000 mg | ORAL_TABLET | Freq: Every day | ORAL | 0 refills | Status: DC

## 2024-01-28 NOTE — Progress Notes (Signed)
 Patient ID: Sydney Mccall, female   DOB: 06-Jul-1986, 38 y.o.   MRN: [id number]   TELEHEALTH GYNECOLOGY VISIT ENCOUNTER NOTE  Provider location: [facility]   Patient location: Grocery store  I connected with Sydney Mccall on 01/28/24 at  2:10 PM EDT by telephone and verified that I am speaking with the correct person using two identifiers. Patient was unable to do MyChart audiovisual encounter due to technical difficulties, she tried several times.    I discussed the limitations, risks, security and privacy concerns of performing an evaluation and management service by telephone and the availability of in person appointments. I also discussed with the patient that there may be a patient responsible charge related to this service. The patient expressed understanding and agreed to proceed.   History:  Sydney Mccall is a 38 y.o. [phone] female being evaluated today for weight and BP check.. She denies any headaches  or other concerns.       Past Medical History:  Diagnosis Date   BV (bacterial vaginosis) 09/06/2014   Ectopic pregnancy 02/28/2014   Genital herpes    Headache    Herpes genitalia    HSV-2 infection complicating pregnancy 05/23/2013   On acyclovir  400mg  TID    NSVD (normal spontaneous vaginal delivery) 08/24/2013   Postpartum hemorrhage    history   Postpartum hemorrhage 07/13/2012   Pregnant    Vaginal itching 09/06/2014   Past Surgical History:  Procedure Laterality Date   BREAST CYST EXCISION     DIAGNOSTIC LAPAROSCOPY WITH REMOVAL OF ECTOPIC PREGNANCY Left 03/01/2014   Procedure: LAPAROSCOPIC REMOVAL OF LEFT ECTOPIC PREGNANCY;  Surgeon: Wendelyn Halter, MD;  Location: [facility];  Service: Gynecology;  Laterality: Left;   LAPAROSCOPIC BILATERAL SALPINGECTOMY Bilateral 03/01/2014   Procedure: LAPAROSCOPIC BILATERAL SALPINGECTOMY;  Surgeon: Wendelyn Halter, MD;  Location: [facility];  Service: Gynecology;  Laterality: Bilateral;   TUBAL  LIGATION Bilateral 08/24/2013   Procedure: POST PARTUM TUBAL LIGATION;  Surgeon: Verlyn Goad, MD;  Location: [facility];  Service: Gynecology;  Laterality: Bilateral;   The following portions of the patient's history were reviewed and updated as appropriate: allergies, current medications, past family history, past medical history, past social history, past surgical history and problem list.   Health Maintenance:  Normal pap and negative HRHPV on 12/01/23  Review of Systems:  Pertinent items noted in HPI and remainder of comprehensive ROS otherwise negative.  Physical Exam:   General:  Alert, oriented and cooperative.   Mental Status: Normal mood and affect perceived. Normal judgment and thought content.  Physical exam deferred due to nature of the encounter BP 115/75 (BP Location: Left Arm, Patient Position: Sitting, Cuff Size: Normal)   Ht 5\' 3"  (1.6 m)   Wt 184 lb (83.5 kg)   LMP 01/11/2024 (Exact Date)   BMI 32.59 kg/m    Upstream - 01/28/24 1419       Pregnancy Intention Screening   Does the patient want to become pregnant in the next year? No    Does the patient's partner want to become pregnant in the next year? No    Would the patient like to discuss contraceptive options today? No      Contraception Wrap Up   Current Method Female Sterilization    End Method Female Sterilization    Contraception Counseling Provided No             Labs and Imaging No results found for this or  any previous visit (from the past 2 weeks). No results found.    Assessment and Plan:      1. Weight loss counseling, encounter for (Primary) She lost 1 lbs Will refill phentermine    Meds ordered this encounter  Medications   phentermine  (ADIPEX-P ) 37.5 MG tablet    Sig: Take 1 tablet (37.5 mg total) by mouth daily before breakfast. Take 1 daily    Dispense:  30 tablet    Refill:  0    Supervising Provider:   Evalyn Hillier H [[id number]]    Follow up in 4 weeks for weight and BP check  2.  Body mass index 32.0-32.9, adult       I discussed the assessment and treatment plan with the patient. The patient was provided an opportunity to ask questions and all were answered. The patient agreed with the plan and demonstrated an understanding of the instructions.   The patient was advised to call back or seek an in-person evaluation/go to the ED if the symptoms worsen or if the condition fails to improve as anticipated.  I provided  8 minutes of non-face-to-face time during this encounter.   Lendia Quay, NP [facility] [employer], [facility]

## 2024-03-03 ENCOUNTER — Ambulatory Visit: Admitting: Adult Health

## 2024-03-03 ENCOUNTER — Encounter: Payer: Self-pay | Admitting: Adult Health

## 2024-03-03 VITALS — BP 123/82 | Ht 63.0 in | Wt 182.0 lb

## 2024-03-03 DIAGNOSIS — Z6832 Body mass index (BMI) 32.0-32.9, adult: Secondary | ICD-10-CM

## 2024-03-03 DIAGNOSIS — Z713 Dietary counseling and surveillance: Secondary | ICD-10-CM

## 2024-03-03 MED ORDER — VALACYCLOVIR HCL 500 MG PO TABS: ORAL_TABLET | ORAL | 12 refills | Status: AC

## 2024-03-03 MED ORDER — PHENTERMINE HCL 37.5 MG PO TABS: 37.5000 mg | ORAL_TABLET | Freq: Every day | ORAL | 0 refills | Status: DC

## 2024-03-03 NOTE — Progress Notes (Signed)
 Patient ID: Sydney Mccall, female   DOB: 1986-05-22, 38 y.o.   MRN: [id number]   TELEHEALTH GYNECOLOGY VISIT ENCOUNTER NOTE  Provider location: [facility]   Patient location: work  I connected with Sydney Mccall on 03/03/24 at  2:10 PM EDT by telephone and verified that I am speaking with the correct person using two identifiers. Patient was unable to do MyChart audiovisual encounter due to technical difficulties, she tried several times.    I discussed the limitations, risks, security and privacy concerns of performing an evaluation and management service by telephone and the availability of in person appointments. I also discussed with the patient that there may be a patient responsible charge related to this service. The patient expressed understanding and agreed to proceed.   History:  Sydney Mccall is a 38 y.o. [phone] female being evaluated today for on weight loss, taking phentermine , has lost 2 more lbs. She denies any headaches, does crave sweets at period time, no  other concerns.       Past Medical History:  Diagnosis Date   BV (bacterial vaginosis) 09/06/2014   Ectopic pregnancy 02/28/2014   Genital herpes    Headache    Herpes genitalia    HSV-2 infection complicating pregnancy 05/23/2013   On acyclovir  400mg  TID    NSVD (normal spontaneous vaginal delivery) 08/24/2013   Postpartum hemorrhage    history   Postpartum hemorrhage 07/13/2012   Pregnant    Vaginal itching 09/06/2014   Past Surgical History:  Procedure Laterality Date   BREAST CYST EXCISION     DIAGNOSTIC LAPAROSCOPY WITH REMOVAL OF ECTOPIC PREGNANCY Left 03/01/2014   Procedure: LAPAROSCOPIC REMOVAL OF LEFT ECTOPIC PREGNANCY;  Surgeon: Wendelyn Halter, MD;  Location: [facility];  Service: Gynecology;  Laterality: Left;   LAPAROSCOPIC BILATERAL SALPINGECTOMY Bilateral 03/01/2014   Procedure: LAPAROSCOPIC BILATERAL SALPINGECTOMY;  Surgeon: Wendelyn Halter, MD;  Location: [facility];   Service: Gynecology;  Laterality: Bilateral;   TUBAL LIGATION Bilateral 08/24/2013   Procedure: POST PARTUM TUBAL LIGATION;  Surgeon: Verlyn Goad, MD;  Location: [facility];  Service: Gynecology;  Laterality: Bilateral;   The following portions of the patient's history were reviewed and updated as appropriate: allergies, current medications, past family history, past medical history, past social history, past surgical history and problem list.   Health Maintenance:  Normal pap and negative HRHPV on 12/01/23.  Review of Systems:  Pertinent items noted in HPI and remainder of comprehensive ROS otherwise negative.  Physical Exam:   General:  Alert, oriented and cooperative.   Mental Status: Normal mood and affect perceived. Normal judgment and thought content.  Physical exam deferred due to nature of the encounter BP 123/82 (BP Location: Left Arm, Patient Position: Sitting, Cuff Size: Normal)   Ht 5\' 3"  (1.6 m)   Wt 182 lb (82.6 kg)   LMP 02/06/2024 (Exact Date)   BMI 32.24 kg/m   Labs and Imaging No results found for this or any previous visit (from the past 2 weeks). No results found.    Assessment and Plan:     1. Weight loss counseling, encounter for (Primary) Continue weight loss efforts Will refill phentermine , last one for now Try going to gym and increase water  Meds ordered this encounter  Medications   valACYclovir  (VALTREX ) 500 MG tablet    Sig: Take 1 daily    Dispense:  30 tablet    Refill:  12    Supervising Provider:  EURE, LUTHER H [[id number]]   phentermine  (ADIPEX-P ) 37.5 MG tablet    Sig: Take 1 tablet (37.5 mg total) by mouth daily before breakfast. Take 1 daily    Dispense:  30 tablet    Refill:  0    Supervising Provider:   Evalyn Hillier H [[id number]]    2. Body mass index 32.0-32.9, adult       I discussed the assessment and treatment plan with the patient. The patient was provided an opportunity to ask questions and all were answered. The patient agreed with  the plan and demonstrated an understanding of the instructions.   The patient was advised to call back or seek an in-person evaluation/go to the ED if the symptoms worsen or if the condition fails to improve as anticipated.  I provided 10 minutes of non-face-to-face time during this encounter.   Lendia Quay, NP [facility] [employer], [facility]

## 2024-04-27 ENCOUNTER — Other Ambulatory Visit: Payer: Self-pay | Admitting: Adult Health

## 2024-05-19 ENCOUNTER — Encounter: Payer: Self-pay | Admitting: Adult Health

## 2024-05-19 ENCOUNTER — Ambulatory Visit: Admitting: Adult Health

## 2024-05-19 VITALS — BP 131/84 | HR 92 | Ht 63.0 in | Wt 188.0 lb

## 2024-05-19 DIAGNOSIS — Z713 Dietary counseling and surveillance: Secondary | ICD-10-CM

## 2024-05-19 DIAGNOSIS — R030 Elevated blood-pressure reading, without diagnosis of hypertension: Secondary | ICD-10-CM

## 2024-05-19 DIAGNOSIS — R350 Frequency of micturition: Secondary | ICD-10-CM | POA: Diagnosis not present

## 2024-05-19 DIAGNOSIS — R8290 Unspecified abnormal findings in urine: Secondary | ICD-10-CM

## 2024-05-19 DIAGNOSIS — Z6833 Body mass index (BMI) 33.0-33.9, adult: Secondary | ICD-10-CM

## 2024-05-19 LAB — POCT URINALYSIS DIPSTICK OB
Blood, UA: NEGATIVE
Glucose, UA: NEGATIVE
Ketones, UA: NEGATIVE
Leukocytes, UA: NEGATIVE
Nitrite, UA: NEGATIVE
POC,PROTEIN,UA: NEGATIVE

## 2024-05-19 MED ORDER — PHENTERMINE HCL 37.5 MG PO TABS: 37.5000 mg | ORAL_TABLET | Freq: Every day | ORAL | 0 refills | Status: DC

## 2024-05-19 NOTE — Progress Notes (Signed)
  Subjective:     Patient ID: Sydney Mccall, female   DOB: 1985/11/20, 38 y.o.   MRN: [id number]  HPI Sydney Mccall is a 38 year old black female,single, [phone] in wanting to start back on phentermine for weight loss, has not used in about 3 months. Has had some urinary frequency and urine is hazy.     Component Value Date/Time   DIAGPAP  12/01/2023 1610    - Negative for intraepithelial lesion or malignancy (NILM)   DIAGPAP  08/13/2020 1445    - Negative for intraepithelial lesion or malignancy (NILM)   DIAGPAP  04/01/2017 0000    NEGATIVE FOR INTRAEPITHELIAL LESIONS OR MALIGNANCY.   HPVHIGH Negative 12/01/2023 1610   HPVHIGH Negative 08/13/2020 1445   ADEQPAP  12/01/2023 1610    Satisfactory for evaluation; transformation zone component PRESENT.   ADEQPAP  08/13/2020 1445    Satisfactory for evaluation; transformation zone component PRESENT.   ADEQPAP  04/01/2017 0000    Satisfactory for evaluation  endocervical/transformation zone component PRESENT.    Review of Systems Wants to lose weight Has had some urinary frequency and urine looks hazy  Reviewed past medical,surgical, social and family history. Reviewed medications and allergies.     Objective:   Physical Exam BP 131/84 (BP Location: Right Arm, Patient Position: Sitting, Cuff Size: Normal)   Pulse 92   Ht 5' 3 (1.6 m)   Wt 188 lb (85.3 kg)   LMP 04/29/2024 (Exact Date)   BMI 33.30 kg/m  urine dipstick was negative    Skin warm and dry. Lungs: clear to ausculation bilaterally. Cardiovascular: regular rate and rhythm.  Fall risk is low  Upstream - 05/19/24 1431       Pregnancy Intention Screening   Does the patient want to become pregnant in the next year? No    Does the patient's partner want to become pregnant in the next year? No    Would the patient like to discuss contraceptive options today? No      Contraception Wrap Up   Current Method Female Sterilization    End Method Female Sterilization     Contraception Counseling Provided No          Assessment:     1. Urinary frequency - POC Urinalysis Dipstick OB  2. Cloudy urine Increase water - POC Urinalysis Dipstick OB  3. Weight loss counseling, encounter for (Primary) Will rx phentermine  Meds ordered this encounter  Medications   phentermine (ADIPEX-P) 37.5 MG tablet    Sig: Take 1 tablet (37.5 mg total) by mouth daily before breakfast. Take 1 daily    Dispense:  30 tablet    Refill:  0    Supervising Provider:   JAYNE VONN DEL [[id number]]   Watch portions Increase water Exercise 5/7 days   4. Body mass index 33.0-33.9, adult  5. Elevated BP without diagnosis of hypertension Will check BP in 4 weeks     Plan:     Follow up in 4 weeks for weight and BP, can be telehealth if needed

## 2024-06-16 ENCOUNTER — Ambulatory Visit: Admitting: Adult Health

## 2024-06-16 ENCOUNTER — Encounter: Payer: Self-pay | Admitting: Adult Health

## 2024-06-16 VITALS — BP 120/70 | Ht 63.0 in | Wt 186.0 lb

## 2024-06-16 DIAGNOSIS — Z713 Dietary counseling and surveillance: Secondary | ICD-10-CM

## 2024-06-16 DIAGNOSIS — Z6832 Body mass index (BMI) 32.0-32.9, adult: Secondary | ICD-10-CM | POA: Diagnosis not present

## 2024-06-16 MED ORDER — PHENTERMINE HCL 37.5 MG PO TABS: 37.5000 mg | ORAL_TABLET | Freq: Every day | ORAL | 0 refills | Status: DC

## 2024-06-16 NOTE — Progress Notes (Signed)
 Patient ID: Sydney Mccall, female   DOB: 11/26/1985, 38 y.o.   MRN: [id number]   TELEHEALTH GYNECOLOGY VISIT ENCOUNTER NOTE  Provider location: [facility]   Patient location: work  I connected with Sydney Mccall on 06/16/24 at  9:50 AM EDT by telephone and verified that I am speaking with the correct person using two identifiers. Patient was unable to do MyChart audiovisual encounter due to technical difficulties, she tried several times.    I discussed the limitations, risks, security and privacy concerns of performing an evaluation and management service by telephone and the availability of in person appointments. I also discussed with the patient that there may be a patient responsible charge related to this service. The patient expressed understanding and agreed to proceed.   History:  Sydney Mccall is a 38 y.o. [phone] female being evaluated today for weight and BP, after starting phentermine  in August, lost 2 lbs . She denies any headaches or other concerns.       Past Medical History:  Diagnosis Date   BV (bacterial vaginosis) 09/06/2014   Ectopic pregnancy 02/28/2014   Genital herpes    Headache    Herpes genitalia    HSV-2 infection complicating pregnancy 05/23/2013   On acyclovir  400mg  TID    NSVD (normal spontaneous vaginal delivery) 08/24/2013   Postpartum hemorrhage    history   Postpartum hemorrhage 07/13/2012   Pregnant    Vaginal itching 09/06/2014   Past Surgical History:  Procedure Laterality Date   BREAST CYST EXCISION     DIAGNOSTIC LAPAROSCOPY WITH REMOVAL OF ECTOPIC PREGNANCY Left 03/01/2014   Procedure: LAPAROSCOPIC REMOVAL OF LEFT ECTOPIC PREGNANCY;  Surgeon: Sydney VEAR Inch, MD;  Location: [facility];  Service: Gynecology;  Laterality: Left;   LAPAROSCOPIC BILATERAL SALPINGECTOMY Bilateral 03/01/2014   Procedure: LAPAROSCOPIC BILATERAL SALPINGECTOMY;  Surgeon: Sydney VEAR Inch, MD;  Location: [facility];  Service: Gynecology;   Laterality: Bilateral;   TUBAL LIGATION Bilateral 08/24/2013   Procedure: POST PARTUM TUBAL LIGATION;  Surgeon: Sydney Felt, MD;  Location: [facility];  Service: Gynecology;  Laterality: Bilateral;   The following portions of the patient's history were reviewed and updated as appropriate: allergies, current medications, past family history, past medical history, past social history, past surgical history and problem list.   Health Maintenance:  Normal pap and negative HRHPV on 11/11/23.   Review of Systems:  Pertinent items noted in HPI and remainder of comprehensive ROS otherwise negative.  Physical Exam:   General:  Alert, oriented and cooperative.   Mental Status: Normal mood and affect perceived. Normal judgment and thought content.  Physical exam deferred due to nature of the encounter BP 120/70   Ht 5' 3 (1.6 m)   Wt 186 lb (84.4 kg)   LMP 05/27/2024 (Exact Date)   BMI 32.95 kg/m   Fall risk is low  Upstream - 06/16/24 0957       Pregnancy Intention Screening   Does the patient want to become pregnant in the next year? No    Does the patient's partner want to become pregnant in the next year? No    Would the patient like to discuss contraceptive options today? No      Contraception Wrap Up   Current Method Female Sterilization    End Method Female Sterilization    Contraception Counseling Provided No          Labs and Imaging No results found for this or any previous  visit (from the past 2 weeks). No results found.    Assessment and Plan:       1. Weight loss counseling, encounter for (Primary) Continue working out and increase water Will refill phentermine  Meds ordered this encounter  Medications   phentermine  (ADIPEX-P ) 37.5 MG tablet    Sig: Take 1 tablet (37.5 mg total) by mouth daily before breakfast. Take 1 daily    Dispense:  30 tablet    Refill:  0    Supervising Provider:   JAYNE MINDER Mccall [[id number]]   Follow up in 4 weeks for Weight and BP check   2.  Body mass index 32.0-32.9, adult       I discussed the assessment and treatment plan with the patient. The patient was provided an opportunity to ask questions and all were answered. The patient agreed with the plan and demonstrated an understanding of the instructions.   The patient was advised to call back or seek an in-person evaluation/go to the ED if the symptoms worsen or if the condition fails to improve as anticipated.  I provided 10 minutes of non-face-to-face time during this encounter.   Sydney Lewis, NP [facility] [employer], [facility]

## 2024-07-14 ENCOUNTER — Encounter: Payer: Self-pay | Admitting: Adult Health

## 2024-07-14 ENCOUNTER — Ambulatory Visit: Admitting: Adult Health

## 2024-07-14 VITALS — BP 122/70 | Ht 63.0 in | Wt 182.0 lb

## 2024-07-14 DIAGNOSIS — Z6832 Body mass index (BMI) 32.0-32.9, adult: Secondary | ICD-10-CM | POA: Diagnosis not present

## 2024-07-14 DIAGNOSIS — E669 Obesity, unspecified: Secondary | ICD-10-CM

## 2024-07-14 DIAGNOSIS — Z713 Dietary counseling and surveillance: Secondary | ICD-10-CM

## 2024-07-14 MED ORDER — PHENTERMINE HCL 37.5 MG PO TABS: 37.5000 mg | ORAL_TABLET | Freq: Every day | ORAL | 0 refills | Status: AC

## 2024-07-14 NOTE — Progress Notes (Addendum)
 Patient ID: Sydney Mccall, female   DOB: 01/08/1986, 38 y.o.   MRN: [id number]   TELEHEALTH GYNECOLOGY VISIT ENCOUNTER NOTE  Provider location: [facility]   Patient location: work  I connected with Sydney Mccall on 07/14/24 at  2:10 PM EDT by telephone and verified that I am speaking with the correct person using two identifiers. Patient was unable to do MyChart audiovisual encounter due to technical difficulties, she tried several times.    I discussed the limitations, risks, security and privacy concerns of performing an evaluation and management service by telephone and the availability of in person appointments. I also discussed with the patient that there may be a patient responsible charge related to this service. The patient expressed understanding and agreed to proceed.   History:  Sydney Mccall is a 38 y.o. [phone] female being evaluated today for weight and BP, has lost 4 lbs since last visit. She denies any headaches or  other concerns.       Past Medical History:  Diagnosis Date   BV (bacterial vaginosis) 09/06/2014   Ectopic pregnancy 02/28/2014   Genital herpes    Headache    Herpes genitalia    HSV-2 infection complicating pregnancy 05/23/2013   On acyclovir  400mg  TID    NSVD (normal spontaneous vaginal delivery) 08/24/2013   Postpartum hemorrhage    history   Postpartum hemorrhage 07/13/2012   Pregnant    Vaginal itching 09/06/2014   Past Surgical History:  Procedure Laterality Date   BREAST CYST EXCISION     DIAGNOSTIC LAPAROSCOPY WITH REMOVAL OF ECTOPIC PREGNANCY Left 03/01/2014   Procedure: LAPAROSCOPIC REMOVAL OF LEFT ECTOPIC PREGNANCY;  Surgeon: Vonn VEAR Inch, MD;  Location: [facility];  Service: Gynecology;  Laterality: Left;   LAPAROSCOPIC BILATERAL SALPINGECTOMY Bilateral 03/01/2014   Procedure: LAPAROSCOPIC BILATERAL SALPINGECTOMY;  Surgeon: Vonn VEAR Inch, MD;  Location: [facility];  Service: Gynecology;  Laterality:  Bilateral;   TUBAL LIGATION Bilateral 08/24/2013   Procedure: POST PARTUM TUBAL LIGATION;  Surgeon: Winton Felt, MD;  Location: [facility];  Service: Gynecology;  Laterality: Bilateral;   The following portions of the patient's history were reviewed and updated as appropriate: allergies, current medications, past family history, past medical history, past social history, past surgical history and problem list.   Health Maintenance:  Normal pap and negative HRHPV on 12/01/23.   Review of Systems:  Pertinent items noted in HPI and remainder of comprehensive ROS otherwise negative.  Physical Exam:   General:  Alert, oriented and cooperative.   Mental Status: Normal mood and affect perceived. Normal judgment and thought content.  Physical exam deferred due to nature of the encounter BP 122/70   Ht 5' 3 (1.6 m)   Wt 182 lb (82.6 kg)   LMP 06/23/2024 (Exact Date)   BMI 32.24 kg/m    Upstream - 07/14/24 1408       Pregnancy Intention Screening   Does the patient want to become pregnant in the next year? No    Does the patient's partner want to become pregnant in the next year? No    Would the patient like to discuss contraceptive options today? No      Contraception Wrap Up   Current Method Female Sterilization    End Method Female Sterilization    Contraception Counseling Provided No          Labs and Imaging No results found for this or any previous visit (from the past 2  weeks). No results found.    Assessment and Plan:     1. Weight loss counseling, encounter for (Primary) Continue weight loss efforts, will refill phentermine  and then will stop for a while   2. Body mass index 32.0-32.9, adult       I discussed the assessment and treatment plan with the patient. The patient was provided an opportunity to ask questions and all were answered. The patient agreed with the plan and demonstrated an understanding of the instructions.   The patient was advised to call back or  seek an in-person evaluation/go to the ED if the symptoms worsen or if the condition fails to improve as anticipated.  I provided 10 minutes of non-face-to-face time during this encounter.   Delon Lewis, NP [facility] [employer]
